# Patient Record
Sex: Male | Born: 1965
Health system: Southern US, Community
[De-identification: ages and names within clinical notes are randomized; demographics above are authoritative.]

## PROBLEM LIST (undated history)

## (undated) DIAGNOSIS — I10 Essential (primary) hypertension: Secondary | ICD-10-CM

## (undated) DIAGNOSIS — K611 Rectal abscess: Secondary | ICD-10-CM

## (undated) DIAGNOSIS — J310 Chronic rhinitis: Secondary | ICD-10-CM

## (undated) DIAGNOSIS — T485X5A Adverse effect of other anti-common-cold drugs, initial encounter: Secondary | ICD-10-CM

## (undated) HISTORY — DX: Essential (primary) hypertension: I10

## (undated) HISTORY — PX: THUMB AMPUTATION: SHX804

## (undated) HISTORY — DX: Rectal abscess: K61.1

## (undated) HISTORY — DX: Adverse effect of other anti-common-cold drugs, initial encounter: T48.5X5A

## (undated) HISTORY — PX: TESTICLE SURGERY: SHX794

## (undated) HISTORY — DX: Chronic rhinitis: J31.0

---

## 1999-11-27 ENCOUNTER — Encounter: Payer: Self-pay | Admitting: *Deleted

## 1999-11-27 ENCOUNTER — Ambulatory Visit (HOSPITAL_COMMUNITY): Admission: RE | Admit: 1999-11-27 | Discharge: 1999-11-27 | Payer: Self-pay | Admitting: *Deleted

## 1999-12-10 ENCOUNTER — Encounter: Admission: RE | Admit: 1999-12-10 | Discharge: 2000-03-09 | Payer: Self-pay | Admitting: *Deleted

## 2002-11-23 ENCOUNTER — Encounter: Payer: Self-pay | Admitting: Occupational Medicine

## 2002-11-23 ENCOUNTER — Encounter: Admission: RE | Admit: 2002-11-23 | Discharge: 2002-11-23 | Payer: Self-pay | Admitting: Occupational Medicine

## 2005-08-09 ENCOUNTER — Emergency Department (HOSPITAL_COMMUNITY): Admission: EM | Admit: 2005-08-09 | Discharge: 2005-08-09 | Payer: Self-pay | Admitting: Family Medicine

## 2005-11-01 ENCOUNTER — Emergency Department (HOSPITAL_COMMUNITY): Admission: EM | Admit: 2005-11-01 | Discharge: 2005-11-01 | Payer: Self-pay | Admitting: Emergency Medicine

## 2008-02-14 ENCOUNTER — Ambulatory Visit: Payer: Self-pay | Admitting: Family Medicine

## 2008-02-14 ENCOUNTER — Encounter: Payer: Self-pay | Admitting: Family Medicine

## 2008-02-14 DIAGNOSIS — I10 Essential (primary) hypertension: Secondary | ICD-10-CM

## 2008-02-14 LAB — CONVERTED CEMR LAB
ALT: 28 units/L (ref 0–53)
AST: 15 units/L (ref 0–37)
Albumin: 4.8 g/dL (ref 3.5–5.2)
Alkaline Phosphatase: 75 units/L (ref 39–117)
BUN: 17 mg/dL (ref 6–23)
Bilirubin Urine: NEGATIVE
CO2: 22 meq/L (ref 19–32)
Calcium: 10 mg/dL (ref 8.4–10.5)
Chloride: 104 meq/L (ref 96–112)
Creatinine, Ser: 1.13 mg/dL (ref 0.40–1.50)
Glucose, Bld: 96 mg/dL (ref 70–99)
Glucose, Urine, Semiquant: NEGATIVE
HCT: 44.9 % (ref 39.0–52.0)
Hemoglobin: 15.4 g/dL (ref 13.0–17.0)
Ketones, urine, test strip: NEGATIVE
MCHC: 34.3 g/dL (ref 30.0–36.0)
MCV: 87.2 fL (ref 78.0–100.0)
Nitrite: NEGATIVE
Platelets: 160 10*3/uL (ref 150–400)
Potassium: 4.4 meq/L (ref 3.5–5.3)
Protein, U semiquant: NEGATIVE
RBC: 5.15 M/uL (ref 4.22–5.81)
RDW: 14.8 % (ref 11.5–15.5)
Sodium: 141 meq/L (ref 135–145)
Specific Gravity, Urine: 1.02
TSH: 1.144 microintl units/mL (ref 0.350–4.50)
Total Bilirubin: 0.6 mg/dL (ref 0.3–1.2)
Total Protein: 8.5 g/dL — ABNORMAL HIGH (ref 6.0–8.3)
Urobilinogen, UA: 1
WBC Urine, dipstick: NEGATIVE
WBC: 5.3 10*3/uL (ref 4.0–10.5)
pH: 7

## 2008-02-16 ENCOUNTER — Encounter: Payer: Self-pay | Admitting: Family Medicine

## 2008-02-27 ENCOUNTER — Encounter: Payer: Self-pay | Admitting: Family Medicine

## 2008-02-27 ENCOUNTER — Ambulatory Visit: Payer: Self-pay | Admitting: Family Medicine

## 2008-02-27 LAB — CONVERTED CEMR LAB
Cholesterol: 174 mg/dL (ref 0–200)
HDL: 36 mg/dL — ABNORMAL LOW (ref >39)
LDL Cholesterol: 119 mg/dL — ABNORMAL HIGH (ref 0–99)
Total CHOL/HDL Ratio: 4.8
Triglycerides: 93 mg/dL (ref <150)
VLDL: 19 mg/dL (ref 0–40)

## 2008-03-05 ENCOUNTER — Ambulatory Visit: Payer: Self-pay | Admitting: Family Medicine

## 2008-03-05 ENCOUNTER — Encounter: Payer: Self-pay | Admitting: Family Medicine

## 2008-03-06 ENCOUNTER — Telehealth: Payer: Self-pay | Admitting: *Deleted

## 2008-03-06 LAB — CONVERTED CEMR LAB
Calcium: 10.1 mg/dL (ref 8.4–10.5)
Glucose, Bld: 95 mg/dL (ref 70–99)
Potassium: 4 meq/L (ref 3.5–5.3)
Sodium: 140 meq/L (ref 135–145)

## 2008-10-09 ENCOUNTER — Ambulatory Visit: Payer: Self-pay | Admitting: Family Medicine

## 2008-10-21 ENCOUNTER — Telehealth: Payer: Self-pay | Admitting: Family Medicine

## 2008-10-21 ENCOUNTER — Ambulatory Visit: Payer: Self-pay | Admitting: Family Medicine

## 2009-03-23 ENCOUNTER — Inpatient Hospital Stay (HOSPITAL_COMMUNITY): Admission: EM | Admit: 2009-03-23 | Discharge: 2009-03-24 | Payer: Self-pay | Admitting: Emergency Medicine

## 2009-04-17 ENCOUNTER — Ambulatory Visit: Payer: Self-pay | Admitting: Family Medicine

## 2009-04-18 ENCOUNTER — Encounter: Payer: Self-pay | Admitting: Family Medicine

## 2009-04-20 ENCOUNTER — Encounter: Payer: Self-pay | Admitting: Family Medicine

## 2009-12-23 ENCOUNTER — Encounter: Payer: Self-pay | Admitting: Family Medicine

## 2009-12-30 ENCOUNTER — Encounter: Payer: Self-pay | Admitting: Family Medicine

## 2009-12-30 ENCOUNTER — Ambulatory Visit: Payer: Self-pay | Admitting: Family Medicine

## 2009-12-30 LAB — CONVERTED CEMR LAB
ALT: 25 units/L (ref 0–53)
Albumin: 4.6 g/dL (ref 3.5–5.2)
CO2: 25 meq/L (ref 19–32)
Chloride: 107 meq/L (ref 96–112)
Direct LDL: 126 mg/dL — ABNORMAL HIGH
Glucose, Bld: 89 mg/dL (ref 70–99)
PSA: 1.36 ng/mL (ref 0.10–4.00)
Potassium: 4.1 meq/L (ref 3.5–5.3)
Sodium: 140 meq/L (ref 135–145)
Total Bilirubin: 0.6 mg/dL (ref 0.3–1.2)
Total Protein: 7.9 g/dL (ref 6.0–8.3)

## 2009-12-31 ENCOUNTER — Encounter: Payer: Self-pay | Admitting: Family Medicine

## 2010-05-06 ENCOUNTER — Ambulatory Visit (HOSPITAL_COMMUNITY): Admission: RE | Admit: 2010-05-06 | Discharge: 2010-05-06 | Payer: Self-pay | Admitting: Orthopedic Surgery

## 2010-05-20 ENCOUNTER — Ambulatory Visit (HOSPITAL_COMMUNITY): Admission: RE | Admit: 2010-05-20 | Discharge: 2010-05-20 | Payer: Self-pay | Admitting: Orthopedic Surgery

## 2010-08-04 NOTE — Letter (Signed)
Summary: Generic Letter  [facility]  [address]   [place], [place] [address]   Phone: [phone]  Fax: [phone]    12/31/2009  Joseph Singleton [address] [place], [place]  [address]  Dear Joseph Singleton,   I just wanted to let you know that your lab results were normal.  Your cholesterol is a little higher than I would like, but not high enough to start medicine.  Be sure to exercise, eat low-fat/low cholesterol foods.  Also, if you are interested in nutrition counseling, you can call [facility] and schedule an appointment with Dr. Gerilyn Pilgrim, our nutritionist.  Please call if you have any questions or concerns.           Sincerely,   Asher Muir MD  Appended Document: Generic Letter mailed

## 2010-08-04 NOTE — Letter (Signed)
Summary: Generic Letter  [facility]  [address]   [place], [place] [address]   Phone: [phone]  Fax: [phone]    12/23/2009  DRAIDEN MIRSKY [address] [place], [place]  [address]  Dear Mr. SCHANK,    You have not been seen in our office in some time for your blood pressure.  Please call [phone] and schedule an office visit.  I look forward to seeing you.        Sincerely,   Asher Muir MD  Appended Document: Generic Letter mailed

## 2010-08-04 NOTE — Assessment & Plan Note (Signed)
Summary: hypertension f/u   Vital Signs:  Patient profile:   45 year old male Height:      74.5 inches Weight:      215.5 pounds BMI:     27.40 Temp:     98.0 degrees F oral Pulse rate:   82 / minute BP sitting:   157 / 102  (left arm) Cuff size:   large  Vitals Entered By: Gladstone Pih (December 30, 2009 3:23 PM) CC: htn f/u Is Patient Diabetic? No Pain Assessment Patient in pain? no      Comments out of HTN meds X 2 days   Primary Care Provider:  Asher Muir MD  CC:  htn f/u.  History of Present Illness: 1.  hypertension--out of meds 2 days.  checks at home twice a month.  runs 130s/75-80.  no side effects.  no cp, swelling, sob.  on metop and hctz.  due for labs  Habits & Providers  Alcohol-Tobacco-Diet     Tobacco Status: quit     Tobacco Counseling: to quit use of tobacco products     Year Quit: 2006  Current Medications (verified): 1)  Hydrochlorothiazide 25 Mg Tabs (Hydrochlorothiazide) .... Take 1 Tablet By Mouth Daily 2)  Metoprolol Tartrate 50 Mg Tabs (Metoprolol Tartrate) .... Take 1 Tab By Mouth Two Times A Day For Blood Pressure  Allergies: No Known Drug Allergies  Past History:  Past Medical History: Last updated: 03/05/2008 htn--diagnosed 8/09  Past Surgical History: Last updated: 04/17/2009 left thumb--partial amputation after injury on job surgery on his testicles--not sure the exact diagnosis (not cancerous) incision and drainage in OR for perianal abscess  Family History: Last updated: 02/14/2008 mother--MI heart attack probably in 40s; may have had rheumatic fever  father--deceased at age  , pancreas removed then secondary DM  brother--htn  brother--pancreas removed then secondary DM  sister--lupus, asthma  kids--healthy  Social History: Last updated: 03/05/2008 lives with son, wife, daughter, aunt former smoker:  10-15 pack year history  quit 1-2 years ago occasional etoh < 2 month no ilicit drugs 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    Physical Exam  General:  Well-developed,well-nourished,in no acute distress; alert,appropriate and cooperative throughout examination Lungs:  Normal respiratory effort, chest expands symmetrically. Lungs are clear to auscultation, no crackles or wheezes. Heart:  Normal rate and regular rhythm. S1 and S2 normal without gallop, murmur, click, rub or other extra sounds. Additional Exam:  vital signs reviewed    Impression & Recommendations:  Problem # 1:  HYPERTENSION (ICD-401.9) Assessment Deteriorated worse off of meds.  refill meds.  nurse visit for bp recheck in next few weeks to make sure at goal.  check ldl and cmet.   His updated medication list for this problem includes:    Hydrochlorothiazide 25 Mg Tabs (Hydrochlorothiazide) .Marland Kitchen... Take 1 tablet by mouth daily    Metoprolol Tartrate 50 Mg Tabs (Metoprolol tartrate) .Marland Kitchen... Take 1 tab by mouth two times a day for blood pressure  Orders: Comp Met-[facility] [phone]) T-Lipoprotein (LDL cholesterol)  ([address]-91478) [facility]- Est Level  3 ([address])  Problem # 2:  PROSTATE CANCER SCREEN (ICD-V76.44) Assessment: Comment Only  Orders: PSA-[facility] ([id number])  Complete Medication List: 1)  Hydrochlorothiazide 25 Mg Tabs (Hydrochlorothiazide) .... Take 1 tablet by mouth daily 2)  Metoprolol Tartrate 50 Mg Tabs (Metoprolol  tartrate) .... Take 1 tab by mouth two times a day for blood pressure  Patient Instructions: 1)  It was nice to see you today. 2)  I will call you if there is a problem with your labs. 3)  Make an appointment for a nurse visit for blood pressure check. 4)  Please schedule a follow-up appointment in 6 months with your new doctor.  Prescriptions: METOPROLOL TARTRATE 50 MG TABS (METOPROLOL TARTRATE) take 1 tab by mouth  two times a day for blood pressure  #60 x 6   Entered and Authorized by:   Asher Muir MD   Signed by:   Asher Muir MD on 12/30/2009   Method used:   Electronically to        [facility] (retail)       [address]       [place], [place]  [address]       Ph: [phone]       Fax: [phone]   RxID:   [id number] HYDROCHLOROTHIAZIDE 25 MG TABS (HYDROCHLOROTHIAZIDE) take 1 tablet by mouth daily  #30 x 6   Entered and Authorized by:   Asher Muir MD   Signed by:   Asher Muir MD on 12/30/2009   Method used:   Electronically to        [facility] (retail)       [address]       [place], [place]  [address]       Ph: [phone]       Fax: [phone]   RxID:   [id number]   Prevention & Chronic Care Immunizations   Influenza vaccine: Not documented    Tetanus booster: Not documented    Pneumococcal vaccine: Not documented  Other Screening   PSA: Not documented   PSA ordered.   Smoking status: quit  (12/30/2009)  Lipids   Total Cholesterol: 174  (02/27/2008)   Lipid panel action/deferral: LDL Direct Ordered   LDL: 010  (02/27/2008)   LDL Direct: Not documented   HDL: 36  (02/27/2008)   Triglycerides: 93  (02/27/2008)  Hypertension   Last Blood Pressure: 157 / 102  (12/30/2009)   Serum creatinine: 1.22  (03/05/2008)   Serum potassium 4.0  (03/05/2008) CMP ordered   Self-Management Support :   Personal Goals (by the next clinic visit) :      Personal blood pressure goal: 140/90  (04/17/2009)   Hypertension self-management support: Written self-care plan  (04/17/2009)

## 2010-08-04 NOTE — Assessment & Plan Note (Signed)
Summary: boil on bottom,tcb   Vital Signs:  Patient profile:   45 year old male Height:      74.5 inches Weight:      216 pounds BMI:     27.46 Temp:     98 degrees F oral Pulse rate:   79 / minute BP sitting:   158 / 104  (left arm) Cuff size:   regular  Vitals Entered By: Molly Maduro busick, ma CC: boil on bottom Is Patient Diabetic? No Pain Assessment Patient in pain? yes     Location: rectal Intensity: 3   Primary Care Provider:  Asher Muir MD  CC:  boil on bottom.  History of Present Illness: 45 yo AAM c/o boils on buttocks.  1. Boils: was admitted to [facility]  ~ 1 month ago for perianal abscess requiring I&D in the OR along with IV Abx, was followed by surgery for several weeks to monitor healing abscess and cleared at last visit. noticed 2 new lesions a few days ago and wants to make sure that they are taken care of before needing to go to the hospital again. they are tender, making it hard for him to sit comfortably. they are getting worse per his wife. he has no prior history of abscesses.  2. HTN: Rx HCTZ, Metoprolol. Uncontrolled today and at previous several visits. He is resistant to adding medication today. wants to wait until his visit with PCP.  He denies fever/chills, CP, SOB, N/V/D, HA. dizziness, vision changes, lower extremity edema, abdominal pain.  Habits & Providers  Alcohol-Tobacco-Diet     Tobacco Status: quit  Current Medications (verified): 1)  Flonase 50 Mcg/act Susp (Fluticasone Propionate) .... 2 Sprays in Each Nostril Daily 2)  Hydrochlorothiazide 25 Mg Tabs (Hydrochlorothiazide) .... Take 1 Tablet By Mouth Daily 3)  Metoprolol Tartrate 50 Mg Tabs (Metoprolol Tartrate) .... Take 1 Tab By Mouth Two Times A Day For Blood Pressure 4)  Hydrocodone-Acetaminophen 5-325 Mg Tabs (Hydrocodone-Acetaminophen) .... One By Mouth Q 4-6 Hours As Needed Pain 5)  Bactrim Ds 800-160 Mg Tabs (Sulfamethoxazole-Trimethoprim) .... Two By Mouth Two Times A Day X  14 Days  Allergies (verified): No Known Drug Allergies  Past History:  Past medical, surgical, family and social histories (including risk factors) reviewed, and no changes noted (except as noted below).  Past Medical History: Reviewed history from 03/05/2008 and no changes required. htn--diagnosed 8/09  Past Surgical History: left thumb--partial amputation after injury on job surgery on his testicles--not sure the exact diagnosis (not cancerous) incision and drainage in OR for perianal abscess  Family History: Reviewed history from 02/14/2008 and no changes required. mother--MI heart attack probably in 40s; may have had rheumatic fever  father--deceased at age  , pancreas removed then secondary DM  brother--htn  brother--pancreas removed then secondary DM  sister--lupus, asthma  kids--healthy  Social History: Reviewed history from 03/05/2008 and no changes required. 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  The patient denies fever, weight loss, chest pain, syncope, dyspnea on exertion, peripheral edema, prolonged cough, headaches, and abdominal pain.    Physical Exam  General:  Well-developed,well-nourished, in no acute distress; alert, appropriate and cooperative throughout examination. Vitals reviewed.  Skin:  right buttock: 6 cm firm, erythematous area with central fluctuance, no drainage. right perianal: (old) healing lesion (2 cm) with no drainage, no sloughing. left perianal: 3-4 cm firm, erythematous area with central fluctuance, no drainage. all 3 ttp. Psych:  Oriented X3, memory intact for recent and remote, normally interactive, and good eye contact.     Impression &  Recommendations:  Problem # 1:  CELLULITIS AND ABSCESS OF BUTTOCK (ICD-682.5) Assessment New Incision and Drainage: benefits and risks of procedure discussed. consent obtained. time in noted. area cleaned with betadine and alcohol swabs, local anesthesia using 1% lidocaine with epi, abscesses lanced with 11 blade scalpel, small amount of pus drained from each lesion, cultured obtained, antibiotic ointment applied, pressure bandage applied to each. time out noted. note: lesions superficial, there was no need for irrigation or packing. min bleeding. aftercare instructions given.   Advised to follow up with PCP Monday. Because the patient needed I&D in the OR and IV Abx less than 1 month ago and the lesion on his right buttock was over 5 cm, he was given Rx Bactrim. Pain medication also given.  His updated medication list for this problem includes:    Bactrim Ds 800-160 Mg Tabs (Sulfamethoxazole-trimethoprim) .Marland Kitchen..Marland Kitchen Two by mouth two times a day x 14 days  Orders: Culture, Wound -[facility] (04540) I&D Abcess, simple- [facility] (10060) [facility]- Est  Level 4 (98119)  Problem # 2:  HYPERTENSION (ICD-401.9)  Patient with uncontrolled HTN. He is not interested in adding a medication today. Advised him to f/u with PCP in 1 week or less for this issue. Rec: low salt diet. His updated medication list for this problem includes:    Hydrochlorothiazide 25 Mg Tabs (Hydrochlorothiazide) .Marland Kitchen... Take 1 tablet by mouth daily    Metoprolol Tartrate 50 Mg Tabs (Metoprolol tartrate) .Marland Kitchen... Take 1 tab by mouth two times a day for blood pressure  Orders: [facility]- Est  Level 4 ([address]214)  Complete Medication List: 1)  Flonase 50 Mcg/act Susp (Fluticasone propionate) .... 2 sprays in each nostril daily 2)  Hydrochlorothiazide 25 Mg Tabs (Hydrochlorothiazide) .... Take 1 tablet by mouth daily 3)  Metoprolol Tartrate 50 Mg Tabs (Metoprolol tartrate) .... Take 1 tab by mouth two times a day for blood pressure 4)  Hydrocodone-acetaminophen  5-325 Mg Tabs (Hydrocodone-acetaminophen) .... One by mouth q 4-6 hours as needed pain 5)  Bactrim Ds 800-160 Mg Tabs (Sulfamethoxazole-trimethoprim) .... Two by mouth two times a day x 14 days  Patient Instructions: 1)  It was nice to meet you today. 2)  Follow up on Monday.  Prescriptions: BACTRIM DS 800-160 MG TABS (SULFAMETHOXAZOLE-TRIMETHOPRIM) two by mouth two times a day x 14 days  #56 x 0   Entered and Authorized by:   Helane Rima DO   Signed by:   Helane Rima DO on 04/17/2009   Method used:   Electronically to        [facility] (retail)       [address]       [place], [place]  [address]       Ph: [phone]       Fax: [phone]   RxID:   [id number] BACTRIM DS 800-160 MG TABS (SULFAMETHOXAZOLE-TRIMETHOPRIM) two by mouth two times a day x 14 days  #56 x 0   Entered and Authorized by:   Helane Rima DO   Signed by:   Helane Rima DO on 04/17/2009  Method used:   Print then Give to Patient   RxID:   [id number] HYDROCODONE-ACETAMINOPHEN 5-325 MG TABS (HYDROCODONE-ACETAMINOPHEN) one by mouth q 4-6 hours as needed pain  #30 x 0   Entered and Authorized by:   Helane Rima DO   Signed by:   Helane Rima DO on 04/17/2009   Method used:   Print then Give to Patient   RxID:   [id number]   Prevention & Chronic Care Immunizations   Influenza vaccine: Not documented    Tetanus booster: Not documented    Pneumococcal vaccine: Not documented  Other Screening   PSA: Not documented   Smoking status: quit  (04/17/2009)  Lipids   Total Cholesterol: 174  (02/27/2008)   LDL: 119  (02/27/2008)   LDL Direct: Not documented   HDL: 36  (02/27/2008)   Triglycerides: 93  (02/27/2008)  Hypertension   Last Blood Pressure: 158 / 104  (04/17/2009)   Serum creatinine: 1.22  (03/05/2008)   Serum potassium 4.0  (03/05/2008)    Hypertension flowsheet reviewed?: Yes   Progress toward BP goal: Unchanged  Self-Management  Support :   Personal Goals (by the next clinic visit) :      Personal blood pressure goal: 140/90  (04/17/2009)   Patient will work on the following items until the next clinic visit to reach self-care goals:     Medications and monitoring: take my medicines every day  (04/17/2009)     Eating: drink diet soda or water instead of juice or soda, eat more vegetables, use fresh or frozen vegetables, eat foods that are low in salt, eat baked foods instead of fried foods, eat fruit for snacks and desserts, limit or avoid alcohol  (04/17/2009)     Activity: take a 30 minute walk every day, take the stairs instead of the elevator, park at the far end of the parking lot  (04/17/2009)    Hypertension self-management support: Written self-care plan  (04/17/2009)   Hypertension self-care plan printed.

## 2010-08-24 ENCOUNTER — Encounter: Payer: Self-pay | Admitting: *Deleted

## 2010-10-09 LAB — BASIC METABOLIC PANEL
BUN: 12 mg/dL (ref 6–23)
Creatinine, Ser: 1.14 mg/dL (ref 0.4–1.5)
GFR calc non Af Amer: >60 mL/min (ref >60)
Glucose, Bld: 112 mg/dL — ABNORMAL HIGH (ref 70–99)
Potassium: 3.7 mEq/L (ref 3.5–5.1)

## 2010-10-09 LAB — ANAEROBIC CULTURE

## 2010-10-09 LAB — DIFFERENTIAL
Basophils Absolute: 0 10*3/uL (ref 0.0–0.1)
Basophils Relative: 0 % (ref 0–1)
Lymphocytes Relative: 10 % — ABNORMAL LOW (ref 12–46)
Neutro Abs: 9.3 10*3/uL — ABNORMAL HIGH (ref 1.7–7.7)
Neutrophils Relative %: 82 % — ABNORMAL HIGH (ref 43–77)

## 2010-10-09 LAB — CULTURE, BLOOD (ROUTINE X 2)
Culture: NO GROWTH
Culture: NO GROWTH

## 2010-10-09 LAB — CULTURE, ROUTINE-ABSCESS

## 2010-10-09 LAB — CBC
HCT: 42.7 % (ref 39.0–52.0)
MCV: 89.9 fL (ref 78.0–100.0)
Platelets: 127 10*3/uL — ABNORMAL LOW (ref 150–400)
RDW: 13.9 % (ref 11.5–15.5)

## 2010-11-20 NOTE — Op Note (Signed)
NAMEJOSHUE, BADAL NO.:  [id number]   MEDICAL RECORD NO.:  [id number]          PATIENT TYPE:  EMS   LOCATION:  [facility]                         FACILITY:  [facility]   PHYSICIAN:  Artist Pais. Weingold, M.D.DATE OF BIRTH:  1966/06/14   DATE OF PROCEDURE:  11/01/2005  DATE OF DISCHARGE:  11/01/2005                                 OPERATIVE REPORT   PREOPERATIVE DIAGNOSIS:  Left thumb tibial amputation with exposed  phalangeal bone, nail plate fracture, nailbed laceration.   POSTOPERATIVE DIAGNOSIS:  Left thumb tibial amputation with exposed  phalangeal bone, nail plate fracture, nailbed laceration.   PROCEDURE:  Incision and drainage and V-Y flap coverage, left thumb tip  amputation.   SURGEON:  Artist Pais. Mina Marble, M.D.   ASSISTANT:  None.   ANESTHESIA:  General.   TOURNIQUET TIME:  Twenty-five minutes.   COMPLICATIONS:  None.   DRAINS:  None.   OPERATIVE REPORT:  Patient was taken to the operating room with the  induction of adequate general anesthesia.  The left upper extremity was  prepped and draped in the usual sterile fashion.  An esmarch was used to  exsanguinate the limb.  The tourniquet was then inflated to 250 mmHg.  At  this point in time, the left thumb was approached surgically.  There was an  obvious nail plate fracture, nailbed laceration, exposed distal phalangeal  bone with the very distal 2 mm of the tip exposed.  After thorough  irrigation and debridement of all nonviable materials, the very distal  aspect of the distal phalanx was carefully rongeured down to a smooth  surface.  The nail plate was also contoured to fit the edge of the bone.  After this was done, two complex lacerations, radial and ulnar side  respectively, were carefully closed with 4-0 nylon.  After this was done,  there was still a 1.5 cm area of distal phalangeal bone that was exposed.  This was carefully done covered using a V-Y advancement flap from volar to  dorsal.   It was raised and advanced 1 cm, sutured to the nail plate and the  proximal portion was closed with 4-0 nylon.  After this was done, the  remaining aspects were carefully approximated using 4-0 nylon, including  three  sutures into the nail plate and secured distally, thus covering the exposed  distal phalangeal bone.  The wound was then irrigated.  It was then dressed  in Xeroform, 4x4s, and a compressive light Coban dressing.  This is  proceeded by injection of 0.25% Marcaine for postoperative pain control.      Artist Pais Mina Marble, M.D.  Electronically Signed     MAW/MEDQ  D:  11/01/2005  T:  11/02/2005  Job:  [id number]

## 2010-11-20 NOTE — Consult Note (Signed)
NAMELOEL, BETANCUR NO.:  [id number]   MEDICAL RECORD NO.:  [id number]          PATIENT TYPE:  EMS   LOCATION:  [facility]                         FACILITY:  [facility]   PHYSICIAN:  Artist Pais. Weingold, M.D.DATE OF BIRTH:  October 30, 1965   DATE OF CONSULTATION:  11/01/2005  DATE OF DISCHARGE:  11/01/2005                                   CONSULTATION   REASON FOR CONSULTATION:  Nicholes Hibler is a very pleasant 45 year old left  hand dominant male who got his dominant left thumb caught in an industrial  machine earlier today, November 01, 2005.  He presents today with a tip  amputation and exposed distal phalangeal bone, nail plate fracture, and nail  bed laceration.  He is an otherwise healthy 45 year old male with NO KNOWN  DRUG ALLERGIES, no current medications, and no recent hospitalizations or  surgery.   FAMILY HISTORY:  Noncontributory.   SOCIAL HISTORY:  Noncontributory.   PHYSICAL EXAMINATION:  GENERAL:  Well-nourished male, pleasant.  EXTREMITIES:  On examination of his left thumb, he has an obvious open tip  injury with exposed distal phalangeal bone, nail plate fracture, and nail  bed laceration.  He has full __________ and EPL function, but again,  significant injury to the tip of his left thumb.   IMPRESSION:  A 45 year old male with a tip injury, dominant left thumb.   RECOMMENDATIONS:  At this point in time, we will take him to the operating  room at the patient's request versus doing it in the ER under local  anesthetic for I&D, revision of amputation, and V-Y flap if necessary, left  thumb.      Artist Pais Mina Marble, M.D.  Electronically Signed     MAW/MEDQ  D:  11/01/2005  T:  11/02/2005  Job:  [id number]

## 2011-01-11 ENCOUNTER — Other Ambulatory Visit: Payer: Self-pay | Admitting: Family Medicine

## 2011-01-11 MED ORDER — METOPROLOL TARTRATE 50 MG PO TABS: 50.0000 mg | ORAL_TABLET | Freq: Two times a day (BID) | ORAL | Status: DC

## 2011-01-11 MED ORDER — HYDROCHLOROTHIAZIDE 25 MG PO TABS: 25.0000 mg | ORAL_TABLET | Freq: Every day | ORAL | Status: DC

## 2011-01-11 NOTE — Telephone Encounter (Signed)
Refilled based on fax request.  Must make appointment before additional refills.

## 2011-01-15 ENCOUNTER — Other Ambulatory Visit: Payer: Self-pay | Admitting: Family Medicine

## 2011-01-15 DIAGNOSIS — I10 Essential (primary) hypertension: Secondary | ICD-10-CM

## 2011-01-15 MED ORDER — HYDROCHLOROTHIAZIDE 25 MG PO TABS: 25.0000 mg | ORAL_TABLET | Freq: Every day | ORAL | Status: DC

## 2011-03-09 ENCOUNTER — Other Ambulatory Visit: Payer: Self-pay | Admitting: Family Medicine

## 2011-03-10 NOTE — Telephone Encounter (Signed)
Refill request

## 2011-05-07 ENCOUNTER — Emergency Department (HOSPITAL_COMMUNITY)
Admission: EM | Admit: 2011-05-07 | Discharge: 2011-05-07 | Disposition: A | Discharge status: Home / Self Care | Payer: BC Managed Care – PPO | Attending: Emergency Medicine | Admitting: Emergency Medicine

## 2011-05-07 ENCOUNTER — Emergency Department (HOSPITAL_COMMUNITY): Payer: BC Managed Care – PPO

## 2011-05-07 DIAGNOSIS — L03317 Cellulitis of buttock: Secondary | ICD-10-CM | POA: Insufficient documentation

## 2011-05-07 DIAGNOSIS — R6883 Chills (without fever): Secondary | ICD-10-CM | POA: Insufficient documentation

## 2011-05-07 DIAGNOSIS — L0231 Cutaneous abscess of buttock: Secondary | ICD-10-CM | POA: Insufficient documentation

## 2011-05-07 DIAGNOSIS — I10 Essential (primary) hypertension: Secondary | ICD-10-CM | POA: Insufficient documentation

## 2011-05-07 LAB — DIFFERENTIAL
Eosinophils Absolute: 0 10*3/uL (ref 0.0–0.7)
Eosinophils Relative: 0 % (ref 0–5)
Lymphocytes Relative: 14 % (ref 12–46)
Lymphs Abs: 1.4 10*3/uL (ref 0.7–4.0)
Monocytes Relative: 7 % (ref 3–12)

## 2011-05-07 LAB — CBC
HCT: 38 % — ABNORMAL LOW (ref 39.0–52.0)
MCH: 30.1 pg (ref 26.0–34.0)
MCV: 85.4 fL (ref 78.0–100.0)
RBC: 4.45 MIL/uL (ref 4.22–5.81)
WBC: 10.1 10*3/uL (ref 4.0–10.5)

## 2011-05-07 LAB — BASIC METABOLIC PANEL
BUN: 7 mg/dL (ref 6–23)
CO2: 27 mEq/L (ref 19–32)
Calcium: 9.6 mg/dL (ref 8.4–10.5)
Chloride: 103 mEq/L (ref 96–112)
Creatinine, Ser: 1.02 mg/dL (ref 0.50–1.35)

## 2011-05-07 MED ORDER — IOHEXOL 300 MG/ML  SOLN
100.0000 mL | Freq: Once | INTRAMUSCULAR | Status: AC | PRN
  Administered 2011-05-07: 100 mL via INTRAVENOUS

## 2011-05-25 ENCOUNTER — Encounter: Payer: Self-pay | Admitting: Family Medicine

## 2011-06-01 ENCOUNTER — Encounter: Payer: Self-pay | Admitting: Family Medicine

## 2011-06-07 ENCOUNTER — Encounter: Payer: Self-pay | Admitting: Family Medicine

## 2011-06-07 ENCOUNTER — Ambulatory Visit (INDEPENDENT_AMBULATORY_CARE_PROVIDER_SITE_OTHER): Payer: BC Managed Care – PPO | Admitting: Family Medicine

## 2011-06-07 VITALS — BP 118/79 | HR 60 | Temp 98.2°F | Ht 75.0 in | Wt 206.5 lb

## 2011-06-07 DIAGNOSIS — Z23 Encounter for immunization: Secondary | ICD-10-CM

## 2011-06-07 DIAGNOSIS — I10 Essential (primary) hypertension: Secondary | ICD-10-CM

## 2011-06-07 DIAGNOSIS — K611 Rectal abscess: Secondary | ICD-10-CM

## 2011-06-07 DIAGNOSIS — K612 Anorectal abscess: Secondary | ICD-10-CM

## 2011-06-07 HISTORY — DX: Rectal abscess: K61.1

## 2011-06-07 MED ORDER — METOPROLOL TARTRATE 50 MG PO TABS: 50.0000 mg | ORAL_TABLET | Freq: Two times a day (BID) | ORAL | Status: DC

## 2011-06-07 MED ORDER — HYDROCHLOROTHIAZIDE 25 MG PO TABS: 25.0000 mg | ORAL_TABLET | Freq: Every day | ORAL | Status: DC

## 2011-06-07 NOTE — Assessment & Plan Note (Signed)
Resolving for patient.

## 2011-06-07 NOTE — Progress Notes (Signed)
  Subjective:    Patient ID: Joseph Singleton, male    DOB: June 01, 1966, 45 y.o.   MRN: [id number]  HPI  CPE.  1. ED f/u. Seen for peri-rectal abscess in ED, underwent I&D after CT showed now underlying invasion of deep tissues. Wound is heeling and no longer a problem according to patient.  2. HTN. No problems with medications. Has not checked BP at home recently. Denies edema, syncope, visual changes. On review of records, has lost 6 lbs in past year. Does not smoke, has history of 10-15 pack years.  I have reviewed the patient's medical history in detail and updated the computerized patient record. Refuses flu shot today.  Review of Systems See HPI otherwise negative.    Objective:   Physical Exam  Vitals reviewed. Constitutional: He is oriented to person, place, and time. He appears well-developed and well-nourished. No distress.  HENT:  Head: Normocephalic and atraumatic.  Mouth/Throat: Oropharynx is clear and moist. No oropharyngeal exudate.  Eyes: EOM are normal. Pupils are equal, round, and reactive to light.  Cardiovascular: Normal rate, regular rhythm, normal heart sounds and intact distal pulses.   No murmur heard. Pulmonary/Chest: Effort normal and breath sounds normal. No respiratory distress. He has no wheezes. He has no rales.  Musculoskeletal: He exhibits no edema and no tenderness.  Neurological: He is alert and oriented to person, place, and time. He exhibits normal muscle tone. Coordination normal.  Psychiatric: He has a normal mood and affect.          Assessment & Plan:

## 2011-06-07 NOTE — Patient Instructions (Signed)
Nice to see you. Blood pressure in normal today! Make an appointment in one year for check up. I have sent meds to [facility].  Hypertension As your heart beats, it forces blood through your arteries. This force is your blood pressure. If the pressure is too high, it is called hypertension (HTN) or high blood pressure. HTN is dangerous because you may have it and not know it. High blood pressure may mean that your heart has to work harder to pump blood. Your arteries may be narrow or stiff. The extra work puts you at risk for heart disease, stroke, and other problems.  Blood pressure consists of two numbers, a higher number over a lower, 110/72, for example. It is stated as "110 over 72." The ideal is below 120 for the top number (systolic) and under 80 for the bottom (diastolic). Write down your blood pressure today. You should pay close attention to your blood pressure if you have certain conditions such as:  Heart failure.   Prior heart attack.   Diabetes   Chronic kidney disease.   Prior stroke.   Multiple risk factors for heart disease.  To see if you have HTN, your blood pressure should be measured while you are seated with your arm held at the level of the heart. It should be measured at least twice. A one-time elevated blood pressure reading (especially in the Emergency Department) does not mean that you need treatment. There may be conditions in which the blood pressure is different between your right and left arms. It is important to see your caregiver soon for a recheck. Most people have essential hypertension which means that there is not a specific cause. This type of high blood pressure may be lowered by changing lifestyle factors such as:  Stress.   Smoking.   Lack of exercise.   Excessive weight.   Drug/tobacco/alcohol use.   Eating less salt.  Most people do not have symptoms from high blood pressure until it has caused damage to the body. Effective treatment can  often prevent, delay or reduce that damage. TREATMENT  When a cause has been identified, treatment for high blood pressure is directed at the cause. There are a large number of medications to treat HTN. These fall into several categories, and your caregiver will help you select the medicines that are best for you. Medications may have side effects. You should review side effects with your caregiver. If your blood pressure stays high after you have made lifestyle changes or started on medicines,   Your medication(s) may need to be changed.   Other problems may need to be addressed.   Be certain you understand your prescriptions, and know how and when to take your medicine.   Be sure to follow up with your caregiver within the time frame advised (usually within two weeks) to have your blood pressure rechecked and to review your medications.   If you are taking more than one medicine to lower your blood pressure, make sure you know how and at what times they should be taken. Taking two medicines at the same time can result in blood pressure that is too low.  SEEK IMMEDIATE MEDICAL CARE IF:  You develop a severe headache, blurred or changing vision, or confusion.   You have unusual weakness or numbness, or a faint feeling.   You have severe chest or abdominal pain, vomiting, or breathing problems.  MAKE SURE YOU:   Understand these instructions.   Will watch your condition.  Will get help right away if you are not doing well or get worse.  Document Released: 06/21/2005 Document Revised: 03/03/2011 Document Reviewed: 02/09/2008 [facility] Patient Information 2012 [place], [place].

## 2011-06-07 NOTE — Assessment & Plan Note (Signed)
At goal on current meds, will refill today. Recent labs normal. Will repeat FLP in one year as last check was below 130 and has controlled hypertension.

## 2011-08-30 ENCOUNTER — Telehealth: Payer: Self-pay | Admitting: *Deleted

## 2011-08-30 ENCOUNTER — Other Ambulatory Visit: Payer: Self-pay | Admitting: *Deleted

## 2011-08-30 NOTE — Telephone Encounter (Signed)
Medical Evaluation for Social Services received and placed in Dr. Ernest Haber box for completion.  Kathrine Cords, Nori Riis

## 2011-09-01 ENCOUNTER — Ambulatory Visit (INDEPENDENT_AMBULATORY_CARE_PROVIDER_SITE_OTHER): Payer: BC Managed Care – PPO | Admitting: *Deleted

## 2011-09-01 DIAGNOSIS — Z111 Encounter for screening for respiratory tuberculosis: Secondary | ICD-10-CM

## 2011-09-01 NOTE — Telephone Encounter (Signed)
Patient is coming in today for PPD.  Forms given to [employer].  Joseph Singleton

## 2011-09-01 NOTE — Telephone Encounter (Signed)
Completed form. Approved for foster parenting medically.

## 2011-09-03 ENCOUNTER — Ambulatory Visit (INDEPENDENT_AMBULATORY_CARE_PROVIDER_SITE_OTHER): Payer: BC Managed Care – PPO | Admitting: *Deleted

## 2011-09-03 DIAGNOSIS — R7611 Nonspecific reaction to tuberculin skin test without active tuberculosis: Secondary | ICD-10-CM

## 2011-09-03 NOTE — Telephone Encounter (Signed)
Form given to patient . Appointment scheduled at [facility] 09/06/2011 at 3:30 for positive PPD.

## 2011-09-03 NOTE — Progress Notes (Signed)
PPD reading 20 mm X 20 mm. Dr. Deirdre Priest consulted with reading and verified. Appointment scheduled at [facility]  with TB control nurse  for 09/06/2011 at 3:30.

## 2011-09-07 ENCOUNTER — Ambulatory Visit
Admission: RE | Admit: 2011-09-07 | Discharge: 2011-09-07 | Disposition: A | Discharge status: Home / Self Care | Payer: No Typology Code available for payment source | Source: Ambulatory Visit | Attending: Infectious Diseases | Admitting: Infectious Diseases

## 2011-09-07 ENCOUNTER — Other Ambulatory Visit: Payer: Self-pay | Admitting: Infectious Diseases

## 2011-09-07 DIAGNOSIS — R7611 Nonspecific reaction to tuberculin skin test without active tuberculosis: Secondary | ICD-10-CM

## 2012-01-24 ENCOUNTER — Ambulatory Visit: Payer: Self-pay | Admitting: Sports Medicine

## 2012-06-07 ENCOUNTER — Other Ambulatory Visit: Payer: Self-pay | Admitting: *Deleted

## 2012-06-07 DIAGNOSIS — I10 Essential (primary) hypertension: Secondary | ICD-10-CM

## 2012-06-07 MED ORDER — METOPROLOL TARTRATE 50 MG PO TABS: 50.0000 mg | ORAL_TABLET | Freq: Two times a day (BID) | ORAL | Status: DC

## 2012-06-07 MED ORDER — HYDROCHLOROTHIAZIDE 25 MG PO TABS: 25.0000 mg | ORAL_TABLET | Freq: Every day | ORAL | Status: DC

## 2012-06-22 ENCOUNTER — Encounter: Payer: Self-pay | Admitting: Family Medicine

## 2012-06-22 ENCOUNTER — Ambulatory Visit (INDEPENDENT_AMBULATORY_CARE_PROVIDER_SITE_OTHER): Payer: BC Managed Care – PPO | Admitting: Family Medicine

## 2012-06-22 VITALS — BP 145/95 | HR 78 | Temp 98.7°F | Ht 74.5 in | Wt 211.6 lb

## 2012-06-22 DIAGNOSIS — J310 Chronic rhinitis: Secondary | ICD-10-CM

## 2012-06-22 DIAGNOSIS — I10 Essential (primary) hypertension: Secondary | ICD-10-CM

## 2012-06-22 LAB — COMPREHENSIVE METABOLIC PANEL
Albumin: 4.5 g/dL (ref 3.5–5.2)
BUN: 18 mg/dL (ref 6–23)
CO2: 28 mEq/L (ref 19–32)
Calcium: 10.1 mg/dL (ref 8.4–10.5)
Chloride: 101 mEq/L (ref 96–112)
Creat: 1.14 mg/dL (ref 0.50–1.35)
Glucose, Bld: 93 mg/dL (ref 70–99)
Potassium: 4.2 mEq/L (ref 3.5–5.3)

## 2012-06-22 LAB — CBC
MCH: 29.5 pg (ref 26.0–34.0)
Platelets: 166 10*3/uL (ref 150–400)
RBC: 4.91 MIL/uL (ref 4.22–5.81)
WBC: 6.7 10*3/uL (ref 4.0–10.5)

## 2012-06-22 LAB — TSH: TSH: 1.581 u[IU]/mL (ref 0.350–4.500)

## 2012-06-22 LAB — POCT GLYCOSYLATED HEMOGLOBIN (HGB A1C): Hemoglobin A1C: 5.4

## 2012-06-22 MED ORDER — FLUTICASONE PROPIONATE 50 MCG/ACT NA SUSP: 2.0000 | Freq: Every day | NASAL | Status: DC

## 2012-06-22 MED ORDER — METOPROLOL TARTRATE 50 MG PO TABS: 100.0000 mg | ORAL_TABLET | Freq: Two times a day (BID) | ORAL | Status: DC

## 2012-06-22 NOTE — Patient Instructions (Addendum)
Nice to see you again. Goal blood pressure is < 140/90. Try to increase your metoprolol to 100 mg twice daily. Notify doctor if side effects of medications. Stop taking afrin. Start taking flonase daily for best results.

## 2012-06-23 ENCOUNTER — Encounter: Payer: Self-pay | Admitting: Family Medicine

## 2012-06-23 DIAGNOSIS — J310 Chronic rhinitis: Secondary | ICD-10-CM

## 2012-06-23 DIAGNOSIS — T485X5A Adverse effect of other anti-common-cold drugs, initial encounter: Secondary | ICD-10-CM

## 2012-06-23 HISTORY — DX: Adverse effect of other anti-common-cold drugs, initial encounter: T48.5X5A

## 2012-06-23 HISTORY — DX: Chronic rhinitis: J31.0

## 2012-06-23 NOTE — Assessment & Plan Note (Signed)
Above goal especially diastolic above 100. Compliant with medication, will increase metoprolol to 100 mg BID. Patient agrees to notify MD if this causes any side effects or is intolerable. If this fails, would change HCTZ to ACEi/HCTZ combination. Will check labs today. Advise f/u for nurse visit in one month or with MD if problems.

## 2012-06-23 NOTE — Assessment & Plan Note (Signed)
Most likely etiology is dependence on afrin. Advised patient to stop this cold [place] or taper, and may use flonase temporarily for relief. Suspect this to improve over next days to weeks. F/u prn.

## 2012-06-23 NOTE — Progress Notes (Signed)
  Subjective:    Patient ID: Joseph Singleton, male    DOB: [birth year], 46 y.o.   MRN: [id number]  HPI CPE  1. HTN. Compliant with medications, denies any side effects. He check infrequently at home usually 130/90s. He works as a Chartered certified accountant, and sometimes plays sports for exercise. Weight is up 5 lbs, but he doesn't notice. Rare alcohol use. Quit smoking 2008. Denies excessive daytime fatigue, breathing problems at night, leg edema, chest pain, dyspnea, abdominal pain, urinary difficulty, fever, rash, myalgias, fatigue, spots in vision, palpitations, weight loss.  2. Stuffy nose. Had a cold 2-3 weeks ago and now complains of persistently "stuffy nose." he is using daily afrin for past 2 weeks which helps for short period then returns. Denies any facial pain, purulent drainage, cough, chest pain, wheezing, post nasal drip, fever, ear pain.   I have reviewed and updated meds, allergies, problem list, past med/surg/social history.   Review of Systems Completed ROS 11 point form is negative except as noted above.     Objective:   Physical Exam  Vitals reviewed. Constitutional: He is oriented to person, place, and time. He appears well-developed and well-nourished. No distress.  HENT:  Head: Normocephalic and atraumatic.  Mouth/Throat: Oropharynx is clear and moist. No oropharyngeal exudate.       Slight nasal congestion audible. No significant nasal edema or lesions.   Eyes: EOM are normal. Pupils are equal, round, and reactive to light. Right eye exhibits no discharge. Left eye exhibits no discharge. No scleral icterus.  Neck: Neck supple. No thyromegaly present.  Cardiovascular: Normal rate, regular rhythm, normal heart sounds and intact distal pulses.   No murmur heard. Pulmonary/Chest: Effort normal and breath sounds normal. No respiratory distress. He has no wheezes. He has no rales.  Abdominal: Bowel sounds are normal. He exhibits no distension. There is no tenderness. There is no guarding.   Musculoskeletal: He exhibits no edema and no tenderness.  Lymphadenopathy:    He has no cervical adenopathy.  Neurological: He is alert and oriented to person, place, and time. Coordination normal.  Skin: No rash noted. He is not diaphoretic.  Psychiatric: He has a normal mood and affect. His behavior is normal.       Assessment & Plan:

## 2012-10-04 ENCOUNTER — Other Ambulatory Visit: Payer: Self-pay | Admitting: Family Medicine

## 2012-11-06 ENCOUNTER — Other Ambulatory Visit: Payer: Self-pay | Admitting: Family Medicine

## 2012-12-07 ENCOUNTER — Other Ambulatory Visit: Payer: Self-pay | Admitting: Family Medicine

## 2012-12-14 ENCOUNTER — Encounter: Payer: Self-pay | Admitting: Family Medicine

## 2012-12-14 ENCOUNTER — Ambulatory Visit (INDEPENDENT_AMBULATORY_CARE_PROVIDER_SITE_OTHER): Payer: BC Managed Care – PPO | Admitting: Family Medicine

## 2012-12-14 VITALS — BP 150/90 | HR 67 | Temp 99.1°F | Ht 74.5 in | Wt 224.0 lb

## 2012-12-14 DIAGNOSIS — I10 Essential (primary) hypertension: Secondary | ICD-10-CM

## 2012-12-14 MED ORDER — METOPROLOL TARTRATE 100 MG PO TABS: ORAL_TABLET | ORAL | Status: DC

## 2012-12-14 MED ORDER — HYDROCHLOROTHIAZIDE 25 MG PO TABS: 25.0000 mg | ORAL_TABLET | Freq: Every day | ORAL | Status: DC

## 2012-12-14 NOTE — Patient Instructions (Addendum)
Your blood pressure is too high today. Needs to be less than 140/90.  Start taking hydrochlorothiazide with the metoprolol twice daily. Make an appointment for nurse check in next 1-2 weeks.  I will call if we need to adjust medications again.   DASH Diet The DASH diet stands for "Dietary Approaches to Stop Hypertension." It is a healthy eating plan that has been shown to reduce high blood pressure (hypertension) in as little as 14 days, while also possibly providing other significant health benefits. These other health benefits include reducing the risk of breast cancer after menopause and reducing the risk of type 2 diabetes, heart disease, colon cancer, and stroke. Health benefits also include weight loss and slowing kidney failure in patients with chronic kidney disease.  DIET GUIDELINES  Limit salt (sodium). Your diet should contain less than 1500 mg of sodium daily.  Limit refined or processed carbohydrates. Your diet should include mostly whole grains. Desserts and added sugars should be used sparingly.  Include small amounts of heart-healthy fats. These types of fats include nuts, oils, and tub margarine. Limit saturated and trans fats. These fats have been shown to be harmful in the body. CHOOSING FOODS  The following food groups are based on a 2000 calorie diet. See your Registered Dietitian for individual calorie needs. Grains and Grain Products (6 to 8 servings daily)  Eat More Often: Whole-wheat bread, Shin rice, whole-grain or wheat pasta, quinoa, popcorn without added fat or salt (air popped).  Eat Less Often: White bread, white pasta, white rice, cornbread. Vegetables (4 to 5 servings daily)  Eat More Often: Fresh, frozen, and canned vegetables. Vegetables may be raw, steamed, roasted, or grilled with a minimal amount of fat.  Eat Less Often/Avoid: Creamed or fried vegetables. Vegetables in a cheese sauce. Fruit (4 to 5 servings daily)  Eat More Often: All fresh, canned  (in natural juice), or frozen fruits. Dried fruits without added sugar. One hundred percent fruit juice ( cup [237 mL] daily).  Eat Less Often: Dried fruits with added sugar. Canned fruit in light or heavy syrup. [employer], Fish, and Poultry (2 servings or less daily. One serving is 3 to 4 oz [85-114 g]).  Eat More Often: Ninety percent or leaner ground beef, tenderloin, sirloin. Round cuts of beef, chicken breast, [place] breast. All fish. Grill, bake, or broil your meat. Nothing should be fried.  Eat Less Often/Avoid: Fatty cuts of meat, [place], or chicken leg, thigh, or wing. Fried cuts of meat or fish. Dairy (2 to 3 servings)  Eat More Often: Low-fat or fat-free milk, low-fat plain or light yogurt, reduced-fat or part-skim cheese.  Eat Less Often/Avoid: Milk (whole, 2%).Whole milk yogurt. Full-fat cheeses. Nuts, Seeds, and Legumes (4 to 5 servings per week)  Eat More Often: All without added salt.  Eat Less Often/Avoid: Salted nuts and seeds, canned beans with added salt. Fats and Sweets (limited)  Eat More Often: Vegetable oils, tub margarines without trans fats, sugar-free gelatin. Mayonnaise and salad dressings.  Eat Less Often/Avoid: Coconut oils, palm oils, butter, stick margarine, cream, half and half, cookies, candy, pie. FOR MORE INFORMATION The Dash Diet Eating Plan: www.dashdiet.org Document Released: 06/10/2011 Document Revised: 09/13/2011 Document Reviewed: 06/10/2011 [facility] Patient Information 2014 [place], [place].

## 2012-12-15 ENCOUNTER — Encounter: Payer: Self-pay | Admitting: Family Medicine

## 2012-12-15 NOTE — Progress Notes (Signed)
  Subjective:    Patient ID: Joseph Singleton, male    DOB: April 11, 1966, 47 y.o.   MRN: [id number]  Hypertension    1. HTN f/u. Has been taking metoprolol BID. Stopped taking HCTZ, though we discussed continuing both meds. He doesn't check BP very often, but last time was 138/80s. He denies any edema, chest pian, visual changes, headaches, fatigue, syncope.   Nonsmoker.   Review of Systems See HPI otherwise negative.  reports that he quit smoking about 5 years ago. His smoking use included Cigarettes. He has a 10 pack-year smoking history. He does not have any smokeless tobacco history on file.     Objective:   Physical Exam  Vitals reviewed. Constitutional: He appears well-developed and well-nourished. No distress.  HENT:  Head: Normocephalic and atraumatic.  Cardiovascular: Normal rate, regular rhythm and normal heart sounds.   No murmur heard. Pulmonary/Chest: Effort normal and breath sounds normal. No respiratory distress. He has no wheezes. He has no rales.  Skin: He is not diaphoretic.        Assessment & Plan:

## 2012-12-15 NOTE — Assessment & Plan Note (Signed)
Above goal. Advised to restart HCTZ and follow up for BP check. We discussed importance of reaching goal < 140/90 to avoid organ damage in long term. He agrees to call with measurements or come for BP check next week. Refilled medications. F/u in 6 months for lab work.

## 2013-02-28 IMAGING — CT CT PELVIS W/ CM
2 of 3 series · 13 of 32 positions shown, 18 images · IV contrast (omnipaque)
Comparison: Pelvic CT - 03/23/2009

CLINICAL DATA: Possible right buttock abscess

CT PELVIS WITH CONTRAST
TECHNIQUE: Multidetector CT imaging of the pelvis was performed
using the standard protocol following the bolus administration of
intravenous contrast.
Contrast: 100mL OMNIPAQUE IOHEXOL 300 MG/ML IV SOLN

[Series 2: routine abdomen · axial · 0.98mm/px · z∈[-289,-59]mm · 5 of 70 slices shown, 10 images]
[im 12/70  soft-tissue]
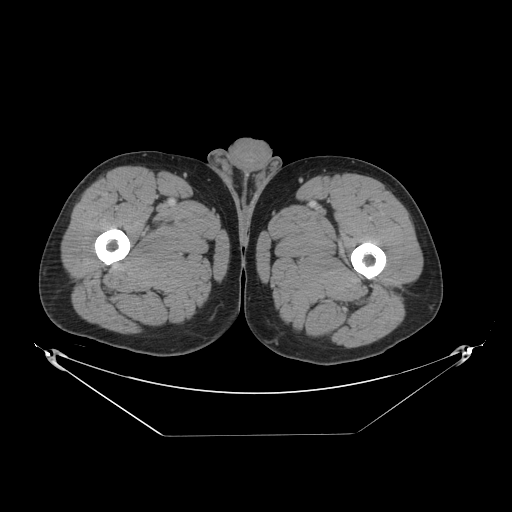
[im 12/70  bone]
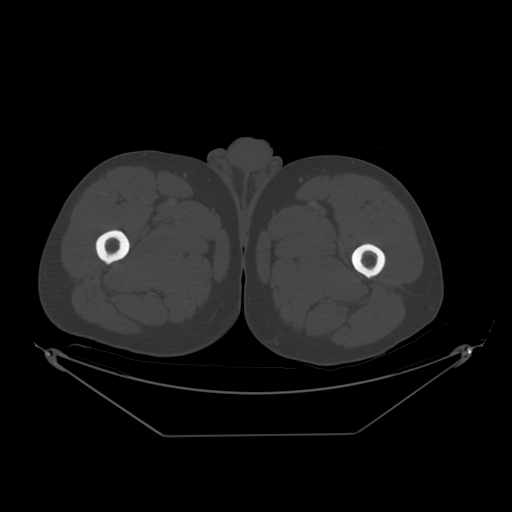
[im 24/70  soft-tissue]
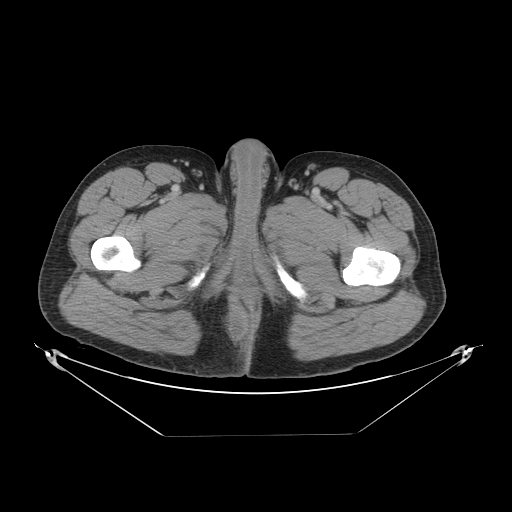
[im 24/70  lung]
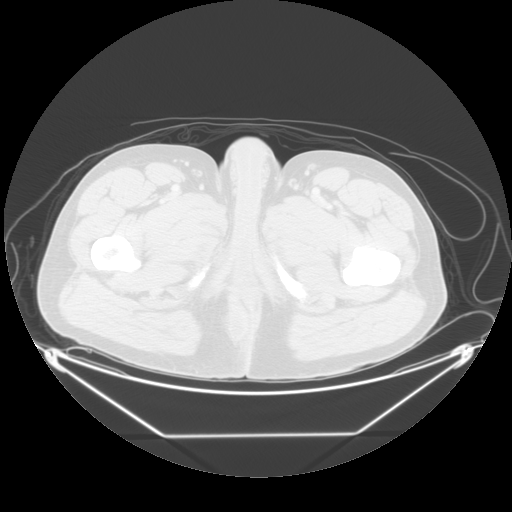
[im 35/70  soft-tissue]
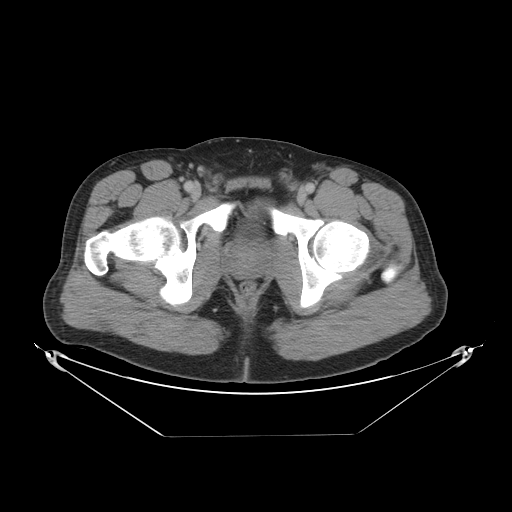
[im 35/70  lung]
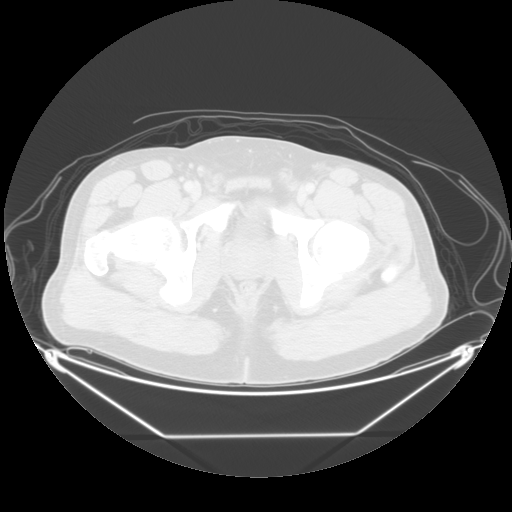
[im 47/70  soft-tissue]
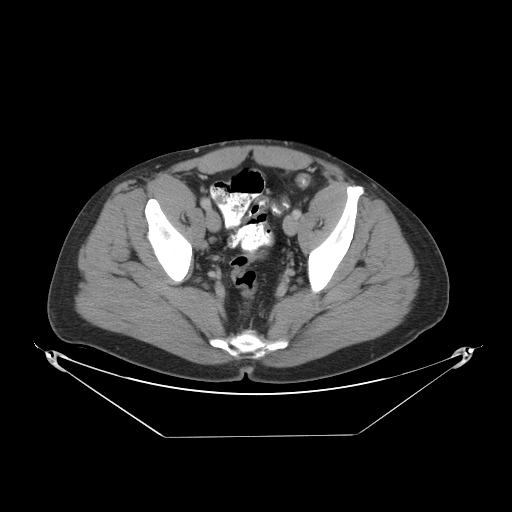
[im 47/70  lung]
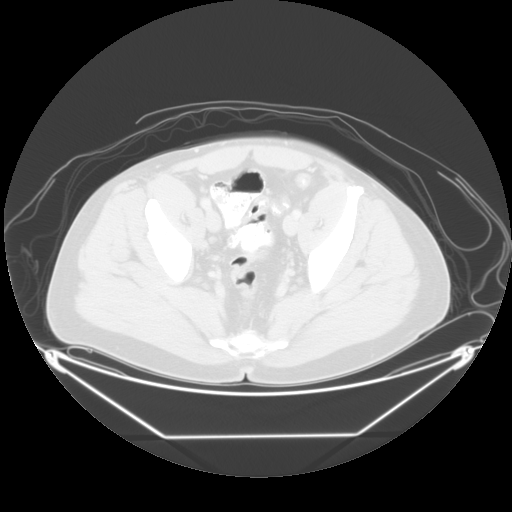
[im 58/70  soft-tissue]
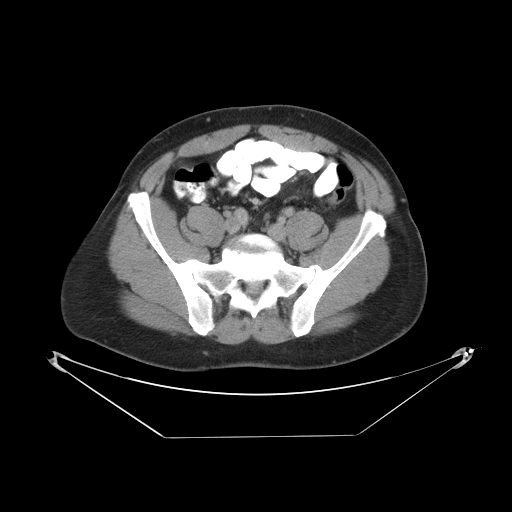
[im 58/70  lung]
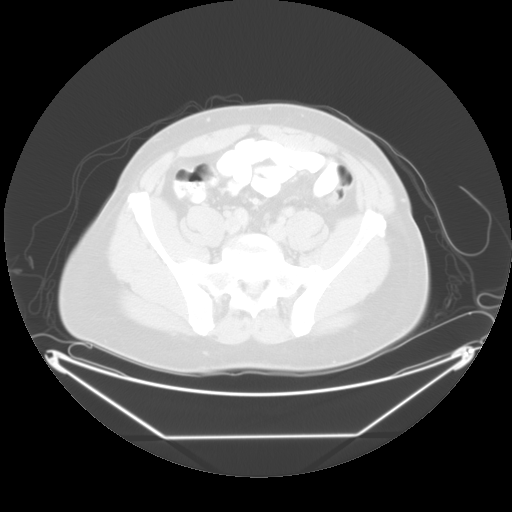

[Series 401: sag · sagittal · 0.98mm/px · 8 of 140 slices shown]
[im 10/140  soft-tissue]
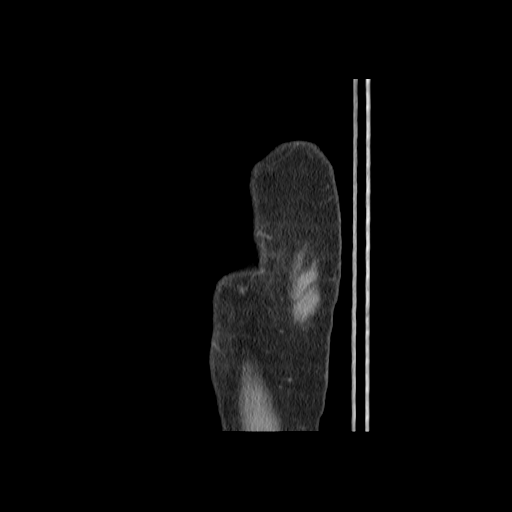
[im 30/140  soft-tissue]
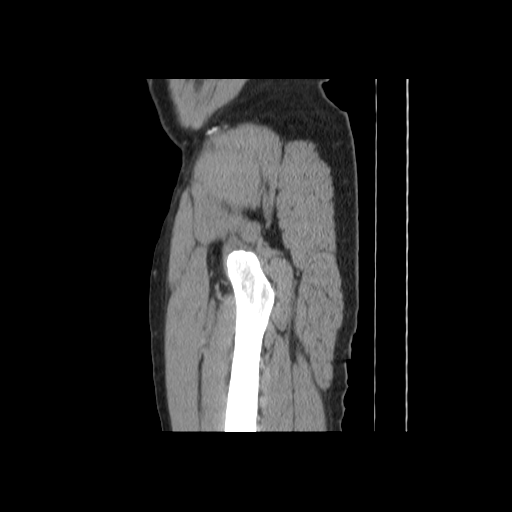
[im 50/140  soft-tissue]
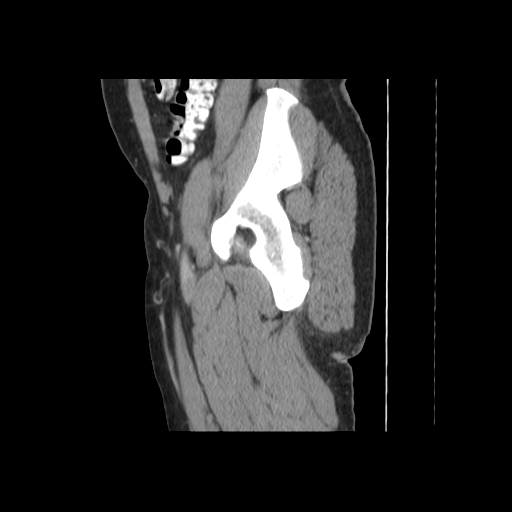
[im 60/140  soft-tissue]
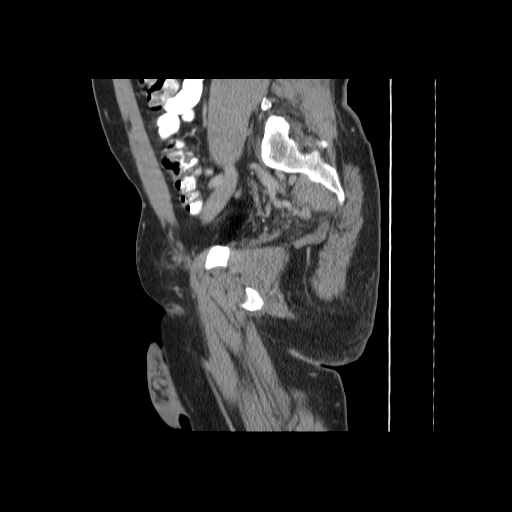
[im 80/140  soft-tissue]
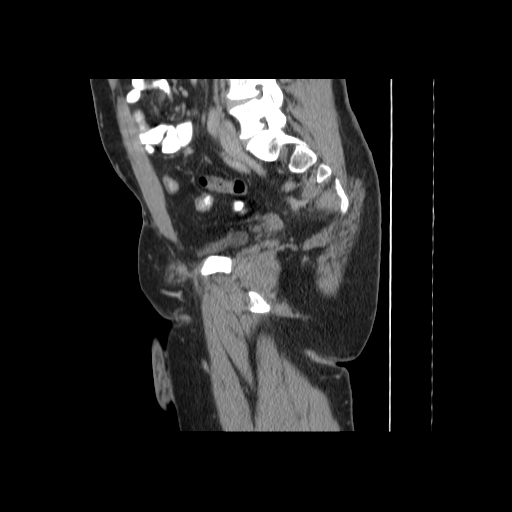
[im 90/140  soft-tissue]
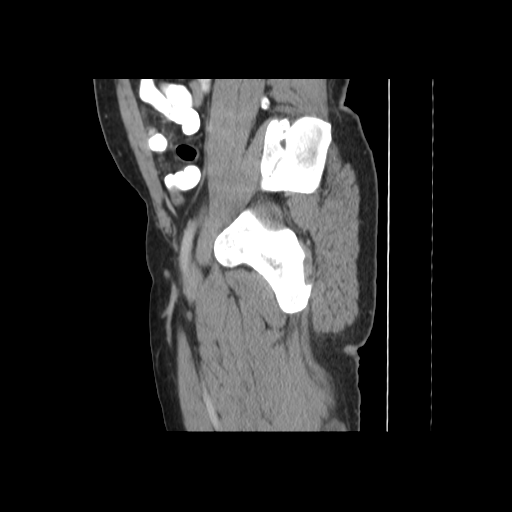
[im 110/140  soft-tissue]
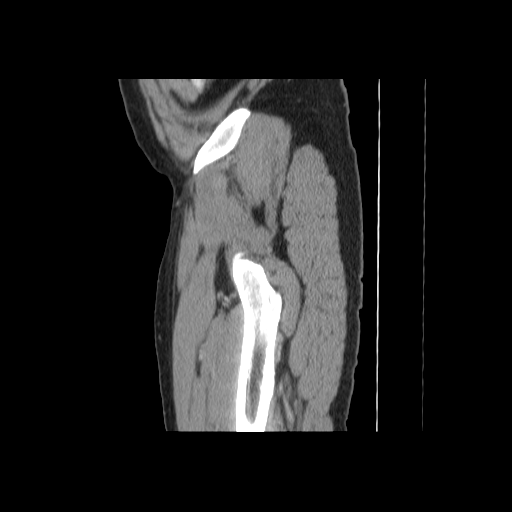
[im 130/140  soft-tissue]
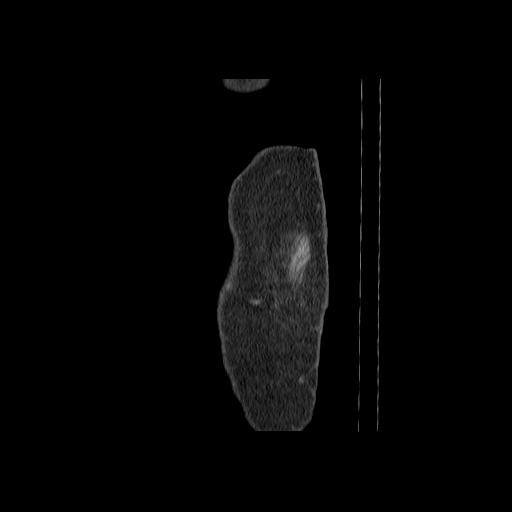

[13 of 32 positions shown; findings below may reference images not displayed]

FINDINGS: There is a recurrent approximately 3.0 x 1.9 cm peripherally
enhancing fluid collection within the posterior aspect of the right
buttocks (image 44, series 2).  The collection appears to
communicate with the adjacent skin possibly via a small sinus tract
(image 49).  There is expected adjacent surrounding subcutaneous
emphysema however no subcutaneous gas or radiopaque foreign body.
The inflammation does not appear to extend into the peritoneal
cavity or pelvic floor.  No definite involvement of the adjacent
rectum.  The visualized bowel loops are normal. Normal appendix.
Borderline enlarged right inguinal lymph node measuring
approximately 1.3 cm in greatest short axis diameter is likely
reactive.  No pelvic lymphadenopathy.  Regional osseous structures
are normal.
IMPRESSION: Recurrent right buttock abscess without definite intraperitoneal or
pelvic floor involvement.

## 2013-07-02 ENCOUNTER — Encounter: Payer: Self-pay | Admitting: Sports Medicine

## 2013-07-02 ENCOUNTER — Ambulatory Visit (INDEPENDENT_AMBULATORY_CARE_PROVIDER_SITE_OTHER): Payer: BC Managed Care – PPO | Admitting: Sports Medicine

## 2013-07-02 VITALS — BP 156/104 | HR 77 | Temp 99.8°F | Ht 74.5 in | Wt 220.0 lb

## 2013-07-02 DIAGNOSIS — Z1211 Encounter for screening for malignant neoplasm of colon: Secondary | ICD-10-CM | POA: Insufficient documentation

## 2013-07-02 DIAGNOSIS — Z299 Encounter for prophylactic measures, unspecified: Secondary | ICD-10-CM

## 2013-07-02 DIAGNOSIS — I10 Essential (primary) hypertension: Secondary | ICD-10-CM

## 2013-07-02 NOTE — Patient Instructions (Signed)
   Stop Metoprolol. 1/2 tablet twice a day for 5 days; then 1/2 tablet daily for 5 days.  Start new BP meds  Follow up for fasting labs including PSA    If you need anything prior to your next visit please call the clinic. Please Bring all medications or accurate medication list with you to each appointment; an accurate medication list is essential in providing you the best care possible.

## 2013-07-02 NOTE — Assessment & Plan Note (Signed)
No history of angioedema so will start ACE inhibitor with hydrochlorothiazide given difficulty in control. Titrate metoprolol off over the next 10 days.

## 2013-07-02 NOTE — Progress Notes (Signed)
  Aijalon Kirtz - 47 y.o. male MRN [id number]  Date of birth: [birth year]  CC, HPI, Interval History & ROS  Justice is here today for CPE and to followup on his chronic medical conditions including:  HTN   He reports overall seems to be doing well.  Not sure as to why his blood pressures as high as it is.  Reports being moderately active  Pt denies chest pain, dyspnea at rest or exertion, PND, lower extremity edema.  Patient denies any facial asymmetry, unilateral weakness, or dysarthria.  Questioning appropriateness of prostate cancer screening and other screening lab work.   No history of tachypalpitations  Denies any urinary symptoms or ED.  No melena, hematochezia.  No weight loss.  Nonsmoker  Other acute problems include: None  Pertinent History & Care Coordination  No Patient Care Coordination Note on file.  History  Smoking status  . Former Smoker -- 1.00 packs/day for 10 years  . Types: Cigarettes  . Quit date: 06/05/2007  Smokeless tobacco  . Not on file   Health Maintenance Due  Topic  . Influenza Vaccine    No results found for this basename: HGBA1C, TRIG, CHOL, HDL, LDLCALC, TSH,  in the last 8760 hours   Otherwise past Medical, Surgical, Social, and Family History Reviewed per EMR Medications and Allergies reviewed and all updated if necessary. Objective Findings  VITALS: HR: 77 bpm  BP: 156/104 mmHg  TEMP: 99.8 F (37.7 C) (Oral)  RESP:    HT: 6' 2.5" (189.2 cm)  WT: 220 lb (99.791 kg)  BMI: 27.9   BP Readings from Last 3 Encounters:  07/02/13 156/104  12/14/12 150/90  06/22/12 145/95   Wt Readings from Last 3 Encounters:  07/02/13 220 lb (99.791 kg)  12/14/12 224 lb (101.606 kg)  06/22/12 211 lb 9.6 oz (95.981 kg)     PHYSICAL EXAM: GENERAL:  adult well built [place] American male. In no discomfort; no respiratory distress  PSYCH: alert and appropriate, good insight   HNEENT:   CARDIO: RRR, S1/S2 heard, no murmur  LUNGS: CTA B, no wheezes,  no crackles  ABDOMEN:   EXTREM:  Warm, well perfused.  Moves all 4 extremities spontaneously; no lateralization.  No noted foot lesions.  Distal pulses 2+/4.  No pretibial edema.  GU:  rectal exam: External rectum normal, no hemorrhoids.  Prostate is flat, 2 fingerbreadths, no nodules   SKIN:      Assessment & Plan   Problems addressed today: General Plan & Pt Instructions:  1. HYPERTENSION   2. Preventive measure       Stop Metoprolol. 1/2 tablet twice a day for 5 days; then 1/2 tablet daily for 5 days.  Start new BP meds  Follow up for fasting labs including PSA     For further discussion of A/P and for follow up issues see problem based charting.

## 2013-07-02 NOTE — Assessment & Plan Note (Signed)
Screening lab work at next visit. Discussed need for screening colonoscopy at age 47. Spent extensive time in counseling the patient regarding PSA screening.  There is no family history or current urinary complaints however patient is aware of possibility of false positives with PSA screening and digital rectal exams and elects to proceed.  No nodules on exam; PSA at last check

## 2013-07-04 ENCOUNTER — Other Ambulatory Visit: Payer: BC Managed Care – PPO

## 2013-07-04 DIAGNOSIS — Z299 Encounter for prophylactic measures, unspecified: Secondary | ICD-10-CM

## 2013-07-04 LAB — COMPREHENSIVE METABOLIC PANEL
ALT: 29 U/L (ref 0–53)
CO2: 26 mEq/L (ref 19–32)
Calcium: 9.2 mg/dL (ref 8.4–10.5)
Chloride: 109 mEq/L (ref 96–112)
Creat: 1.1 mg/dL (ref 0.50–1.35)
Glucose, Bld: 87 mg/dL (ref 70–99)

## 2013-07-04 LAB — PSA: PSA: 1.55 ng/mL (ref <=4.00)

## 2013-07-04 LAB — LIPID PANEL
Cholesterol: 179 mg/dL (ref 0–200)
LDL Cholesterol: 120 mg/dL — ABNORMAL HIGH (ref 0–99)
Triglycerides: 107 mg/dL (ref <150)

## 2013-07-04 NOTE — Progress Notes (Signed)
CMP,FLP AND PSA DONE TODAY Joseph Singleton 

## 2013-07-09 ENCOUNTER — Encounter: Payer: Self-pay | Admitting: Sports Medicine

## 2013-07-09 ENCOUNTER — Telehealth: Payer: Self-pay | Admitting: Sports Medicine

## 2013-07-09 MED ORDER — LISINOPRIL-HYDROCHLOROTHIAZIDE 20-25 MG PO TABS: 1.0000 | ORAL_TABLET | Freq: Every day | ORAL | Status: DC

## 2013-07-09 NOTE — Telephone Encounter (Signed)
Patient was seen by Dr. Berline Choughigby on 07/02/13 and was suppose to be getting a new HTN meds. Nothing was ever called in. Wife would like meds to be called in. Please call patient once completed.

## 2013-07-09 NOTE — Telephone Encounter (Signed)
Rx sent in for Lisinopril/HCTZ

## 2013-07-31 ENCOUNTER — Ambulatory Visit (INDEPENDENT_AMBULATORY_CARE_PROVIDER_SITE_OTHER): Payer: BC Managed Care – PPO | Admitting: Family Medicine

## 2013-07-31 ENCOUNTER — Encounter: Payer: Self-pay | Admitting: Family Medicine

## 2013-07-31 VITALS — BP 131/85 | HR 89 | Temp 99.1°F | Ht 74.5 in | Wt 216.0 lb

## 2013-07-31 DIAGNOSIS — N529 Male erectile dysfunction, unspecified: Secondary | ICD-10-CM | POA: Insufficient documentation

## 2013-07-31 DIAGNOSIS — I10 Essential (primary) hypertension: Secondary | ICD-10-CM

## 2013-07-31 MED ORDER — HYDROCHLOROTHIAZIDE 25 MG PO TABS: 25.0000 mg | ORAL_TABLET | Freq: Every day | ORAL | Status: DC

## 2013-07-31 MED ORDER — AMLODIPINE BESYLATE 5 MG PO TABS: 5.0000 mg | ORAL_TABLET | Freq: Every day | ORAL | Status: DC

## 2013-07-31 NOTE — Patient Instructions (Signed)
Mr. Manson PasseyBrown, it was nice seeing you today.  Please stop taking your Zestoretic and start taking the HCTZ 25 mg one pill per day and the norvasc 5 mg one pill per day.  We will see you back in about 2 weeks and please let us know if your symptoms improve.  Thanks, Dr. Paulina FusiHess

## 2013-07-31 NOTE — Assessment & Plan Note (Addendum)
Pt states he has never had this problem before and started about 3-4 weeks ago.  No changes at that time other than starting Zestoretic and had previously been on HCTZ.  Pt states he is having trouble with both starting an erection and maintaining an erection.  No other changes in diet, exercise, travel, sexual partners.  He denies any penile discharge, dysuria, hematuria.  Does not smoke and occasional alcohol ( < 2-3 drinks per month).  DDx includes medication SE, pt A1C 5.4 one month ago so not diabetic, PAD however no known PAD and no other complaints, Prostate issue (PSA < 2, one month ago) no decreased testosterone evidence including gynecomastia, small testes, and no evidence of visual field defects.  Discussed options with pt and how BP is well controlled on his medication today w/o other SE including edema, cough, palpitations.  Discussed that it could be worth stopping this medication, restarting HCTZ at 25 mg, and starting norvasc 5 mg in AA pt with f/u in two weeks to see if there is an improvement.  If there is at that time, and considering renal protection secondary to possible early stage CKD, would consider ARB.

## 2013-07-31 NOTE — Progress Notes (Signed)
[name] is a 48 y/o male f/u for HTN c/o impotence.  Pt states he has never had this problem before and started about 3-4 weeks ago.  No changes at that time other than starting Zestoretic and had previously been on HCTZ.  Pt states he is having trouble with both starting an erection and maintaining an erection.  No other changes in diet, exercise, travel, sexual partners.  He denies any penile discharge, dysuria, hematuria.  Does not smoke and occasional alcohol ( < 2-3 drinks per month).  Past Medical History  Diagnosis Date  . Hypertension   . Perirectal abscess 06/07/2011    Required I&D, most recently 05/2011 in ED.    Marland Kitchen. Rhinitis medicamentosa 06/23/2012    History  Smoking status  . Former Smoker -- 1.00 packs/day for 10 years  . Types: Cigarettes  . Quit date: 06/05/2007  Smokeless tobacco  . Not on file    Family History  Problem Relation Age of Onset  . Heart disease Mother   . Hypertension Father   . Diabetes Father   . Cancer Maternal Aunt     breast  . Hypertension Brother   . Diabetes Brother   . Asthma Sister   . Lupus Sister     Current Outpatient Prescriptions on File Prior to Visit  Medication Sig Dispense Refill  . lisinopril-hydrochlorothiazide (PRINZIDE,ZESTORETIC) 20-25 MG per tablet Take 1 tablet by mouth daily.  90 tablet  3   No current facility-administered medications on file prior to visit.    ROS: Per HPI.  All other systems reviewed and are negative.   Physical Exam Filed Vitals:   07/31/13 1531  BP: 131/85  Pulse: 89  Temp: 99.1 F (37.3 C)    Physical Examination: General appearance - alert, well appearing, and in no distress Neck - No JVD, bruits B/L  Chest - clear to auscultation, no wheezes, rales or rhonchi, symmetric air entry Heart - normal rate and regular rhythm, no murmurs noted    Chemistry      Component Value Date/Time   NA 138 07/04/2013 0833   K 4.4 07/04/2013 0833   CL 109 07/04/2013 0833   CO2 26 07/04/2013  0833   BUN 12 07/04/2013 0833   CREATININE 1.10 07/04/2013 0833   CREATININE 1.02 05/07/2011 1326      Component Value Date/Time   CALCIUM 9.2 07/04/2013 0833   ALKPHOS 70 07/04/2013 0833   AST 22 07/04/2013 0833   ALT 29 07/04/2013 0833   BILITOT 0.5 07/04/2013 0833     Lab Results  Component Value Date   HGBA1C 5.4 06/22/2012

## 2013-07-31 NOTE — Assessment & Plan Note (Signed)
Will switch to Norvasc 5 mg with HCTZ 25 mg and f/u in two weeks.

## 2013-10-15 ENCOUNTER — Other Ambulatory Visit: Payer: Self-pay | Admitting: Family Medicine

## 2013-11-19 ENCOUNTER — Other Ambulatory Visit: Payer: Self-pay | Admitting: Sports Medicine

## 2014-03-29 ENCOUNTER — Other Ambulatory Visit: Payer: Self-pay | Admitting: *Deleted

## 2014-03-29 ENCOUNTER — Telehealth: Payer: Self-pay | Admitting: Family Medicine

## 2014-03-29 MED ORDER — AMLODIPINE BESYLATE 5 MG PO TABS: 5.0000 mg | ORAL_TABLET | Freq: Every day | ORAL | Status: DC

## 2014-03-29 MED ORDER — HYDROCHLOROTHIAZIDE 25 MG PO TABS: 25.0000 mg | ORAL_TABLET | Freq: Every day | ORAL | Status: DC

## 2014-03-29 NOTE — Telephone Encounter (Signed)
I have received a prescription request for Hypertensive Medications. I will call in meds with 1 refill on each. He needs to make an appointment for future refills. I am his new PCP and have not been able to evaluate/meet him. Thanks

## 2014-04-19 ENCOUNTER — Other Ambulatory Visit: Payer: Self-pay

## 2014-07-22 ENCOUNTER — Encounter: Payer: Self-pay | Admitting: Family Medicine

## 2014-07-22 ENCOUNTER — Ambulatory Visit (INDEPENDENT_AMBULATORY_CARE_PROVIDER_SITE_OTHER): Payer: BLUE CROSS/BLUE SHIELD | Admitting: Family Medicine

## 2014-07-22 ENCOUNTER — Other Ambulatory Visit: Payer: Self-pay | Admitting: Family Medicine

## 2014-07-22 VITALS — BP 182/110 | HR 86 | Temp 97.7°F | Wt 215.9 lb

## 2014-07-22 DIAGNOSIS — M25562 Pain in left knee: Secondary | ICD-10-CM | POA: Diagnosis not present

## 2014-07-22 DIAGNOSIS — S8992XA Unspecified injury of left lower leg, initial encounter: Secondary | ICD-10-CM | POA: Insufficient documentation

## 2014-07-22 MED ORDER — AMLODIPINE BESYLATE 5 MG PO TABS: 5.0000 mg | ORAL_TABLET | Freq: Every day | ORAL | Status: DC

## 2014-07-22 MED ORDER — MELOXICAM 15 MG PO TABS: 15.0000 mg | ORAL_TABLET | Freq: Every day | ORAL | Status: DC

## 2014-07-22 MED ORDER — METHYLPREDNISOLONE ACETATE 80 MG/ML IJ SUSP
80.0000 mg | Freq: Once | INTRAMUSCULAR | Status: AC
  Administered 2014-07-22: 80 mg via INTRA_ARTICULAR

## 2014-07-22 MED ORDER — HYDROCHLOROTHIAZIDE 25 MG PO TABS: 25.0000 mg | ORAL_TABLET | Freq: Every day | ORAL | Status: DC

## 2014-07-22 MED ORDER — HYDROCODONE-ACETAMINOPHEN 5-325 MG PO TABS: 1.0000 | ORAL_TABLET | Freq: Four times a day (QID) | ORAL | Status: DC | PRN

## 2014-07-22 NOTE — Assessment & Plan Note (Signed)
Concern for acute meniscal tear.  Bulging of medial meniscus on US today - Obtain Knee x-ray to evaluate for patella and tibial plateau fracture.  Dobut this is etiology.  - Could have concurrent ACL injury but hard to evaluate today due to guarding and effusion - Start Mobic 15 mg daily, vicodin PRN for pain, compression, ice and elevation - Off work for this week due to his job description - F/U in 2 weeks to see how doing.  If at that time, doing better and w/o mechanical Sx, PT would be a good option for him.  However, if any concerns of ongoing process (ACL or mechanical Sx), an MRI would be a reasonable test at that time.   Attempted to aspirate L knee today with 5 cc lidocaine w/ 21 1 1/2 needle to the superiorlateral L knee.  However, under US, was unable to obtain fluid.  Pt had significant pain despite anesthesia, so this was stopped.  Area was cleaned with betadine x 3 prior to aspiration attempt with a 18 gauge needle and 60 cc syringe.

## 2014-07-22 NOTE — Patient Instructions (Signed)

## 2014-07-22 NOTE — Progress Notes (Signed)
Joseph Singleton is a 49 y.o. male who presents today for L knee pain and effusion.  L knee injury - Pt was playing basketball two days ago when he went to pivot and had immediate pain in the L knee causing him to be unable to move at that time.   Denied immediate swelling at that time but was limping obviously.  Denies previous injury to the L knee and is R leg dominant.  Upon arriving home, he sat down and when he went to stand up, he had severe effusion.  He has tried tylenol with some relief but otherwise has not done anything.  Unable to fully flex or extend his knee due to pain and effusion.  Denies locking catching or instability but is worse when trying to go up the stairs.  Knee has not given out on him at this time, but walking with cane due to pain.    Past Medical History  Diagnosis Date  . Hypertension   . Perirectal abscess 06/07/2011    Required I&D, most recently 05/2011 in ED.    Marland Kitchen Rhinitis medicamentosa 06/23/2012    History  Smoking status  . Former Smoker -- 1.00 packs/day for 10 years  . Types: Cigarettes  . Quit date: 06/05/2007  Smokeless tobacco  . Not on file    Family History  Problem Relation Age of Onset  . Heart disease Mother   . Hypertension Father   . Diabetes Father   . Cancer Maternal Aunt     breast  . Hypertension Brother   . Diabetes Brother   . Asthma Sister   . Lupus Sister     Current Outpatient Prescriptions on File Prior to Visit  Medication Sig Dispense Refill  . amLODipine (NORVASC) 5 MG tablet Take 1 tablet (5 mg total) by mouth daily. 30 tablet 1  . hydrochlorothiazide (HYDRODIURIL) 25 MG tablet Take 1 tablet (25 mg total) by mouth daily. 30 tablet 1   No current facility-administered medications on file prior to visit.    ROS: Per HPI.  All other systems reviewed and are negative.   Physical Exam Filed Vitals:   07/22/14 0947  BP: 182/110  Pulse: 86  Temp: 97.7 F (36.5 C)   Gen: Well NAD Cardio: RRR, No M/G/R Pulm:  CTAB L Knee:  Exam: General (compare with less affected knee)  1. Observation  1. Ecchymosis No. 2. Knee Effusion + on L, 2+  3. Previous surgical scars No. 4. Knee resting position 5. Quadriceps muscle atrophy  1. Evaluate Vastus Medialis Obliquus specifically No. 2. Tenderness to Palpation  1. Patella No. 2. Tibial tubercle No. 3. Patellar tendon No. 4. Quadriceps tendon No. 5. Joint line  1. Medial  Yes.   2. Lateral No. 3. Normal Range of Motion  1. Flexion: 90 degrees 2. Extension: 10 degrees Exam: Patellofemoral - Negative patellar grind and glide  1. Patellar Apprehension Test No. Exam: Anterior Cruciate Ligament (ACL) - Unable to perform anterior drawer, Pivot/Shift, Lachman due to guarding  Exam: Posterior Cruciate Ligament (PCL) Tests - Negative  Exam: Collateral ligament evaluation  1. Knee Valgus Stress Test (Medial collateral ligament) No. 2. Knee Varus Stress Test (Lateral collateral ligament) No. Exam: Meniscus Evaluation  1. McMurray's Test Yes.   2. Apley's Compression Test and Apley's Distraction Test No. 3. Joint line tendernessYes.   4. + Thessaly L   Neurovascular Status - Intact B/L LE    [place] L knee - Obvious suprapatellar effusion.  [employer]  and patellar tendon intact w/o deformity.  Medial meniscus with bulging and hypoechoic region.

## 2014-08-08 ENCOUNTER — Ambulatory Visit: Payer: BLUE CROSS/BLUE SHIELD | Admitting: Family Medicine

## 2014-08-09 ENCOUNTER — Encounter: Payer: Self-pay | Admitting: Family Medicine

## 2014-08-09 ENCOUNTER — Ambulatory Visit (INDEPENDENT_AMBULATORY_CARE_PROVIDER_SITE_OTHER): Payer: BLUE CROSS/BLUE SHIELD | Admitting: Family Medicine

## 2014-08-09 VITALS — BP 156/90 | HR 97 | Temp 98.1°F | Ht 74.5 in | Wt 215.9 lb

## 2014-08-09 DIAGNOSIS — S8992XD Unspecified injury of left lower leg, subsequent encounter: Secondary | ICD-10-CM

## 2014-08-09 DIAGNOSIS — M25562 Pain in left knee: Secondary | ICD-10-CM

## 2014-08-09 NOTE — Assessment & Plan Note (Signed)
Patient with continued pain and significantly decreased range of motion. Patient also with symptoms of instability. As x-ray was not obtained at last visit, will order complete film of the left knee. I suspect the patient may need an MRI for further evaluation (or at least initially a comprehensive/complete ultrasound). Will refer to sports medicine for further evaluation/comprehensive US.

## 2014-08-09 NOTE — Patient Instructions (Addendum)
It was nice to see you today.  Please go get the xray as soon as possible.  I have placed a referral to sports medicine.  They will call you asap.   Sports Medicine [address] [place], [place] [address] Phone: [phone]  Take care  Dr. Adriana Simasook

## 2014-08-09 NOTE — Progress Notes (Signed)
   Subjective:    Patient ID: Joseph Singleton, male    DOB: 05/19/1966, 49 y.o.   MRN: [id number]  HPI 49 year old male presents for follow-up regarding a recent left knee injury.  1) Left knee injury/pain  Patient was seen on 1/18 after suffering a twisting/pivot injury of his left knee while playing a game of basketball 2 days prior.  Patient was seen and evaluated by Dr. Paulina FusiHess.  Ultrasound was performed and revealed a large effusion and medial meniscus bulging.  Aspiration was attempted but was unsuccessful. Depo-Medrol injection was performed.  After patient's visit, x-ray was not obtained.  He presents today for follow-up.  He reports continued left knee pain. Pain is currently 6 out of 10 in severity.  Pain is located on the medial, lateral, and superior aspects.  Pain is worse with physical activity. He has returned back to work and is struggling to complete his tasks at work.  Patient also reports some continued swelling.  Patient also notes popping, clicking, and feelings of instability.  He has been taking for described medications for pain with improvement.  Review of Systems Per HPI    Objective:   Physical Exam Filed Vitals:   08/09/14 1542  BP: 156/90  Pulse: 97  Temp: 98.1 F (36.7 C)   Exam: General: well appearing, NAD. Knee: Left Inspection - mild effusion noted. No redness. No obvious deformity. Palpation with medial and lateral joint line tenderness. ROM significantly limited. Patient is only able to flex approximately 30.   Ligaments with solid consistent endpoints including ACL, PCL, LCL and MCL. Unable to perform McMurray's secondary to decreased range of motion and pain. Patellar and quadriceps tendons unremarkable.    Assessment & Plan:  See Problem List

## 2014-08-13 ENCOUNTER — Telehealth: Payer: Self-pay | Admitting: Family Medicine

## 2014-08-13 NOTE — Telephone Encounter (Signed)
When and where was pt suppose to have Xray? He has to back to work 2-15 . He needs to be out another week or so so he can go to the dr Please advise

## 2014-08-13 NOTE — Telephone Encounter (Signed)
LVM for patient to call back to inform him that X-Ray is done by walk in, [facility] Radiology 1st floor.

## 2014-08-14 ENCOUNTER — Ambulatory Visit (HOSPITAL_COMMUNITY)
Admission: RE | Admit: 2014-08-14 | Discharge: 2014-08-14 | Disposition: A | Discharge status: Home / Self Care | Payer: BLUE CROSS/BLUE SHIELD | Source: Ambulatory Visit | Attending: Family Medicine | Admitting: Family Medicine

## 2014-08-14 DIAGNOSIS — M179 Osteoarthritis of knee, unspecified: Secondary | ICD-10-CM | POA: Insufficient documentation

## 2014-08-14 DIAGNOSIS — M25562 Pain in left knee: Secondary | ICD-10-CM | POA: Diagnosis present

## 2014-08-14 DIAGNOSIS — Y939 Activity, unspecified: Secondary | ICD-10-CM | POA: Insufficient documentation

## 2014-08-15 ENCOUNTER — Ambulatory Visit (INDEPENDENT_AMBULATORY_CARE_PROVIDER_SITE_OTHER): Payer: BLUE CROSS/BLUE SHIELD | Admitting: Family Medicine

## 2014-08-15 ENCOUNTER — Encounter: Payer: Self-pay | Admitting: Family Medicine

## 2014-08-15 VITALS — BP 150/97 | HR 92 | Temp 98.5°F | Ht 74.5 in | Wt 215.5 lb

## 2014-08-15 DIAGNOSIS — I10 Essential (primary) hypertension: Secondary | ICD-10-CM

## 2014-08-15 DIAGNOSIS — Z418 Encounter for other procedures for purposes other than remedying health state: Secondary | ICD-10-CM

## 2014-08-15 DIAGNOSIS — Z299 Encounter for prophylactic measures, unspecified: Secondary | ICD-10-CM

## 2014-08-15 LAB — COMPREHENSIVE METABOLIC PANEL
ALK PHOS: 93 U/L (ref 39–117)
ALT: 31 U/L (ref 0–53)
AST: 23 U/L (ref 0–37)
Albumin: 4.5 g/dL (ref 3.5–5.2)
BILIRUBIN TOTAL: 0.3 mg/dL (ref 0.2–1.2)
BUN: 22 mg/dL (ref 6–23)
CO2: 27 mEq/L (ref 19–32)
Calcium: 9.6 mg/dL (ref 8.4–10.5)
Chloride: 103 mEq/L (ref 96–112)
Creat: 1.39 mg/dL — ABNORMAL HIGH (ref 0.50–1.35)
Glucose, Bld: 99 mg/dL (ref 70–99)
Potassium: 3.8 mEq/L (ref 3.5–5.3)
SODIUM: 138 meq/L (ref 135–145)
Total Protein: 7.5 g/dL (ref 6.0–8.3)

## 2014-08-15 LAB — LDL CHOLESTEROL, DIRECT: LDL DIRECT: 105 mg/dL — AB

## 2014-08-15 LAB — POCT GLYCOSYLATED HEMOGLOBIN (HGB A1C): Hemoglobin A1C: 5.3

## 2014-08-15 MED ORDER — AMLODIPINE BESYLATE 5 MG PO TABS: 5.0000 mg | ORAL_TABLET | Freq: Every day | ORAL | Status: DC

## 2014-08-15 MED ORDER — HYDROCHLOROTHIAZIDE 25 MG PO TABS: 25.0000 mg | ORAL_TABLET | Freq: Two times a day (BID) | ORAL | Status: DC

## 2014-08-15 NOTE — Patient Instructions (Signed)
I'm going to increase her HCTZ to 25 mg twice a day. We will need to follow-up with you in one week for nurse visit, to check your blood pressure. He may eventually need to increase her Norvasc as well. I would like you to start thinking about your colonoscopy, and screening within the next few months. We will complete blood work today, including cholesterol and we will call you once the results become available.

## 2014-08-16 LAB — CBC WITH DIFFERENTIAL/PLATELET
BASOS ABS: 0.1 10*3/uL (ref 0.0–0.1)
BASOS PCT: 1 % (ref 0–1)
Eosinophils Absolute: 0.2 10*3/uL (ref 0.0–0.7)
Eosinophils Relative: 4 % (ref 0–5)
HCT: 42.5 % (ref 39.0–52.0)
HEMOGLOBIN: 14.4 g/dL (ref 13.0–17.0)
LYMPHS ABS: 2.1 10*3/uL (ref 0.7–4.0)
Lymphocytes Relative: 36 % (ref 12–46)
MCH: 30.2 pg (ref 26.0–34.0)
MCHC: 33.9 g/dL (ref 30.0–36.0)
MCV: 89.1 fL (ref 78.0–100.0)
MONOS PCT: 10 % (ref 3–12)
MPV: 13.1 fL — ABNORMAL HIGH (ref 8.6–12.4)
Monocytes Absolute: 0.6 10*3/uL (ref 0.1–1.0)
NEUTROS PCT: 49 % (ref 43–77)
Neutro Abs: 2.8 10*3/uL (ref 1.7–7.7)
Platelets: 153 10*3/uL (ref 150–400)
RBC: 4.77 MIL/uL (ref 4.22–5.81)
RDW: 14.1 % (ref 11.5–15.5)
WBC: 5.7 10*3/uL (ref 4.0–10.5)

## 2014-08-16 LAB — PSA: PSA: 4 ng/mL (ref <=4.00)

## 2014-08-19 NOTE — Assessment & Plan Note (Signed)
Declines flu. Encouraged to schedule colonoscopy.  PSA today Will f/u with results.

## 2014-08-19 NOTE — Progress Notes (Addendum)
   Subjective:    Patient ID: Joseph Singleton, male    DOB: 05/30/1966, 49 y.o.   MRN: [id number]  HPI  [facility]. Patient has no complaints. He is healing from a new knee injury and has an appointment set up with sports medicine next week. He is taking vicodin and mobic for acute injury.   Hypertension: pt currently takes norvasc 5 mg daily and HCTZ QD. He denies chest pain, shortness of breath, leg edema, headaches or changes in vision.  He does not watch his diet. He does usually get a fair amount exercise with basketball prior to recent knee injury. His blood pressure has been elevated the past 6 OV.  Pt reports no difficulty with urination. He has elected to have DRE yearly on passed exams. He is asking for one today. Pts PSA has not been elevated in the past and there is no family history of colon or prostate cancer.   Health maintenance: No colonoscopy prior. Declines flu. Tdap UTD .  Former smoker  Past Medical History  Diagnosis Date  . Hypertension   . Perirectal abscess 06/07/2011    Required I&D, most recently 05/2011 in ED.    Marland Kitchen. Rhinitis medicamentosa 06/23/2012   No Known Allergies   Review of Systems Per HPI    Objective:   Physical Exam BP 150/97 mmHg  Pulse 92  Temp(Src) 98.5 F (36.9 C) (Oral)  Ht 6' 2.5" (1.892 m)  Wt 215 lb 8 oz (97.75 kg)  BMI 27.31 kg/m2 Gen: NAD. Well developed, well nourished. Nontoxic. Alert, oriented.  HEENT: AT. [place]. Bilateral TM visualized and normal in appearance. Bilateral eyes without injections or icterus. MMM. Bilateral nares without erythema or swelling. Throat without erythema or exudates.  CV: RRR, no murmur appreciated.  Chest: CTAB, no wheeze or crackles Abd: Soft. Flat. NTND. BS present. No Masses palpated.  Ext: No erythema. No edema. +2/4 PT. Knee stabilizer in place.  GU: prostate smooth and normal size.  Skin: No rashes, purpura or petechiae. Intact.  Neuro: Limp (acute injury) PERLA.  EOMi. Alert. Grossly intact.  Psych: Normal speech, mood and affect.     Assessment & Plan:

## 2014-08-19 NOTE — Assessment & Plan Note (Signed)
-   increase hctz to BID today, kept norvasc at 5 mg QD - CBC, CMP, A1c, lipids - f/u 2 weeks

## 2014-08-20 ENCOUNTER — Telehealth: Payer: Self-pay | Admitting: Family Medicine

## 2014-08-20 ENCOUNTER — Encounter: Payer: Self-pay | Admitting: Family Medicine

## 2014-08-20 DIAGNOSIS — R972 Elevated prostate specific antigen [PSA]: Secondary | ICD-10-CM

## 2014-08-20 NOTE — Telephone Encounter (Signed)
Call patient and discussed the PSA results, along with all of his other lab results with him today. - PSA 4, this is double from his prior PSA 1 year ago. Will order a free PSA, if low for age will consider urology appointment for him. - Advised patient he would benefit from eating a  little healthier, LDL 105. Encourage fresh fruits and vegetables, no deep-fried foods, lean meats. - Patient with mild AKI, possibly due from increased NSAID use with recent knee injury. Advised patient to drink plenty of water, and only use NSAIDs as needed. Will repeat lab on next appointment to make certain it has resolved. Kuneff, Renee DO

## 2014-08-21 ENCOUNTER — Encounter: Payer: Self-pay | Admitting: Sports Medicine

## 2014-08-21 ENCOUNTER — Ambulatory Visit (INDEPENDENT_AMBULATORY_CARE_PROVIDER_SITE_OTHER): Payer: BLUE CROSS/BLUE SHIELD | Admitting: Sports Medicine

## 2014-08-21 ENCOUNTER — Other Ambulatory Visit: Payer: BLUE CROSS/BLUE SHIELD

## 2014-08-21 VITALS — BP 146/86 | Ht 75.0 in | Wt 215.0 lb

## 2014-08-21 DIAGNOSIS — N179 Acute kidney failure, unspecified: Secondary | ICD-10-CM

## 2014-08-21 DIAGNOSIS — R972 Elevated prostate specific antigen [PSA]: Secondary | ICD-10-CM

## 2014-08-21 DIAGNOSIS — M25462 Effusion, left knee: Secondary | ICD-10-CM

## 2014-08-21 DIAGNOSIS — S8992XD Unspecified injury of left lower leg, subsequent encounter: Secondary | ICD-10-CM

## 2014-08-21 MED ORDER — KETOROLAC TROMETHAMINE 60 MG/2ML IM SOLN
60.0000 mg | Freq: Once | INTRAMUSCULAR | Status: AC
  Administered 2014-08-21: 60 mg via INTRAMUSCULAR

## 2014-08-21 NOTE — Progress Notes (Signed)
Joseph CiscoKevin Singleton - 49 y.o. male MRN [id number]  Date of birth: 08/30/1965  SUBJECTIVE: CC: Left knee pain, initial evaluation HPI: Injury on 07/22/2014. Reporting twisting mechanism playing basketball with his son.   Prior left knee issues seen by Dr. Althea CharonMcKinley. MRI reviewed as below.   s/p attempted aspiration and injection by Dr. Paulina FusiHess on 07/22/2014.  Recent mild AKI questionable from NSAID use.  Reports occasional clicking. No significant instability.  Moderate swelling.  Does not affect him at night and he is able to sleep comfortably. Declines pain medication.  Out of work at this time. Works in a factory and stands. Would like to return to work.  ROS: per HPI  HISTORY:  Past Medical, Surgical, Social, and Family History reviewed & updated per EMR.  Pertinent Historical Findings include:  reports that he quit smoking about 7 years ago. His smoking use included Cigarettes. He has a 10 pack-year smoking history. He does not have any smokeless tobacco history on file. Hypertension, recently elevated PSA.   Historical Data Reviewed: X-ray 08/14/2014: nonweightbearing films, Mild OA changes of the left knee, . No acute bony abnormality.  MRI 12/25/2012: Ordered by Dr. Althea CharonMcKinley:  #1 Radial tear posterior horn near the meniscal root #2 Grade 1 MCL sprain. #3 medial compartment and femorals: Clear groove degenerative chondral thinning #4 Moderate effusion with Baker's cyst   OBJECTIVE:  VS:   HT:6\' 3"  (190.5 cm)   WT:215 lb (97.523 kg)  BMI:26.9          BP:(!) 146/86 mmHg  HR: bpm  TEMP: ( )  RESP:   PHYSICAL EXAM:  GENERAL: Adult African-American. No acute distress PSYCH: Alert and appropriately interactive. VASCULAR: Lower extremity pulses 2+/4, no significant pretibial edema NEURO: Lower extremity strength is 5+/5 in all myotomes; sensation is intact to light touch in all dermatomes. LEFT KNEE: Overall well aligned, Moderate effusion with synovitis. Hypopigmented area over  superior lateral aspect of knee consistent with ethyl chloride use  ROM: 0-120, painful at terminal flexion and extension  Ligaments: Stable to varus and valgus strain anterior posterior drawer, solid Lachman's.  Meniscal testing: Slight pain with McMurray's.  TTP over medial and lateral joint line, pes bursa.  Small Baker's cyst appreciated  DATA OBTAINED DURING VISIT: LIMITED MSK ULTRASOUND OF LEFT KNEE: Findings:  Small effusion, loculated, medial meniscus appears intact at the midline with minimal extrusion. Impression: Small loculated effusion with likely degenerative meniscal tear   ASSESSMENT: 1. Left knee injury, subsequent encounter   2. Acute kidney injury   3. Knee effusion, left    No problems updated.    PROCEDURES: PROCEDURE NOTE : Ultrasound Guided left knee aspiration and injection The risks, benefits and expected outcomes of the injection were reviewed and he wishes to undergo the above named procedure.  Written consent was obtained. After an appropriate time out was taken, the ultrasound was used to identify the target structure and adjacent vascular structures. The left knee was then prepped in a sterile fashion using alcohol and a skin wheal and anesthetized track was obtained using 3 cc of 1% lidocaine on a 27-gauge 1-1/2 inch needle via the superior lateral portal.  The area was cleaned again with alcohol and care was taken to keep the ultrasound probe and jelly away from the sterile field. Under direct visualization with real time Ultrasound guidance the target structure was aspirated and injected as below:             Needle:  18-gauge Aspirate: 10cc blood-tinged  straw-colored synovial fluid                Meds:  3 mL of 1% lidocaine, 60 mL of Toradol             Images: Obtained A bandaid was applied to the area. 6 inch Ace wrap applied to knee. This procedure was well tolerated and there were no complications.   PLAN: See problem based charting & AVS for  additional documentation.  No meniscal and degenerative changes within the knee. Previously doing well until this acute injury. Discussed conservative care with return to work versus obtaining further advanced imaging with MRI. Patient would like a trial of seeing how he does with repeat injection today. He did have improvement following aspiration and Toradol injection.   Needs to avoid systemic NSAID use due to acute kidney injury.  If not improving after return to work on Monday will need MRI and likely surgical referral  > Patient will call and touch base first part of next week and let us know how he is feeling. If persistent pain, and symptoms we'll set up for repeat MRI

## 2014-08-21 NOTE — Progress Notes (Signed)
[facility] phlebotomist drew:  Free PSA

## 2014-08-22 LAB — PSA, FREE
PSA, Free Pct: 16 % — ABNORMAL LOW (ref >25)
PSA, Free: 0.44 ng/mL

## 2014-08-22 LAB — PSA: PSA: 2.74 ng/mL (ref <=4.00)

## 2014-08-23 ENCOUNTER — Telehealth: Payer: Self-pay | Admitting: Family Medicine

## 2014-08-23 DIAGNOSIS — R972 Elevated prostate specific antigen [PSA]: Secondary | ICD-10-CM

## 2014-08-23 NOTE — Telephone Encounter (Signed)
Pt informed of low free psa and a referral will be placed to a urologist.  Pt told he would get a phone call once the referral is placed from [facility] or the urologist office with an appt.  Pt stated understanding.  No questions at this time.  Clovis PuMartin, Malisha Mabey L, RN

## 2014-08-23 NOTE — Telephone Encounter (Signed)
I would like to send [name] to Urology since he has had a double in PSA over a year period and low free PSA. I had spoken with him prior about needing the free psa and then likely sending him to urology, so he is aware that this is a possibility.  Considering he has not had problems with urination and he has no family history of prostate cancer this referral is to get an expert opinion and continuation of monitoring if necessary. Thank you.  RN Please call pt and make him aware of referral/low free psa (which is a little more concerning but not a diagnosis). I will send to [facility] white pool to place referral.

## 2014-09-03 NOTE — Telephone Encounter (Signed)
Mrs. Joseph Singleton calling about husband's form regarding his FMLA.  Please advise as to whether document has been completed and disposition.  Contact pt when completed

## 2014-09-10 NOTE — Telephone Encounter (Signed)
Patient's wife came by and picked these up last week, Dr. Paulina FusiHess signed

## 2014-09-10 NOTE — Telephone Encounter (Signed)
I was out last week, but I do not have FMLA papers in my box for [name]. I will be happy to complete them, if they had not been done already, once I have them. Thanks.

## 2015-02-13 ENCOUNTER — Other Ambulatory Visit: Payer: Self-pay | Admitting: Family Medicine

## 2015-06-05 ENCOUNTER — Other Ambulatory Visit: Payer: Self-pay | Admitting: Family Medicine

## 2015-06-05 NOTE — Telephone Encounter (Signed)
This is your pt, see med refill 

## 2015-06-11 NOTE — Telephone Encounter (Signed)
Hi sorry I can't see which prescription was requested from this patient for some reason. Thanks, Dr. Earlene PlaterWallace

## 2015-06-12 NOTE — Telephone Encounter (Signed)
This refill was for hydrochlorothiazide 25mg 

## 2015-08-14 ENCOUNTER — Other Ambulatory Visit: Payer: Self-pay | Admitting: Family Medicine

## 2015-08-15 NOTE — Telephone Encounter (Signed)
This is your patient, see med refill request 

## 2015-09-26 ENCOUNTER — Other Ambulatory Visit: Payer: Self-pay | Admitting: Family Medicine

## 2015-10-08 ENCOUNTER — Other Ambulatory Visit: Payer: Self-pay | Admitting: Family Medicine

## 2015-11-26 ENCOUNTER — Encounter: Payer: BLUE CROSS/BLUE SHIELD | Admitting: Internal Medicine

## 2016-01-07 ENCOUNTER — Encounter: Payer: Self-pay | Admitting: Internal Medicine

## 2016-01-07 ENCOUNTER — Ambulatory Visit (INDEPENDENT_AMBULATORY_CARE_PROVIDER_SITE_OTHER): Payer: BLUE CROSS/BLUE SHIELD | Admitting: Internal Medicine

## 2016-01-07 VITALS — BP 135/90 | Temp 97.6°F | Ht 75.0 in | Wt 214.4 lb

## 2016-01-07 DIAGNOSIS — Z1211 Encounter for screening for malignant neoplasm of colon: Secondary | ICD-10-CM | POA: Diagnosis not present

## 2016-01-07 DIAGNOSIS — R972 Elevated prostate specific antigen [PSA]: Secondary | ICD-10-CM | POA: Diagnosis not present

## 2016-01-07 DIAGNOSIS — I10 Essential (primary) hypertension: Secondary | ICD-10-CM | POA: Diagnosis not present

## 2016-01-07 LAB — COMPLETE METABOLIC PANEL WITH GFR
ALBUMIN: 4.6 g/dL (ref 3.6–5.1)
ALK PHOS: 88 U/L (ref 40–115)
ALT: 25 U/L (ref 9–46)
AST: 18 U/L (ref 10–35)
BILIRUBIN TOTAL: 0.4 mg/dL (ref 0.2–1.2)
BUN: 13 mg/dL (ref 7–25)
CO2: 26 mmol/L (ref 20–31)
Calcium: 9.5 mg/dL (ref 8.6–10.3)
Chloride: 104 mmol/L (ref 98–110)
Creat: 1.11 mg/dL (ref 0.70–1.33)
GFR, EST NON AFRICAN AMERICAN: 76 mL/min (ref >=60)
GFR, Est African American: 88 mL/min (ref >=60)
GLUCOSE: 88 mg/dL (ref 65–99)
Potassium: 4.1 mmol/L (ref 3.5–5.3)
SODIUM: 138 mmol/L (ref 135–146)
TOTAL PROTEIN: 7.7 g/dL (ref 6.1–8.1)

## 2016-01-07 LAB — LIPID PANEL
CHOLESTEROL: 174 mg/dL (ref 125–200)
HDL: 48 mg/dL (ref >=40)
LDL Cholesterol: 108 mg/dL (ref <130)
Total CHOL/HDL Ratio: 3.6 Ratio (ref <=5.0)
Triglycerides: 91 mg/dL (ref <150)
VLDL: 18 mg/dL (ref <30)

## 2016-01-07 MED ORDER — AMLODIPINE BESYLATE 5 MG PO TABS: 5.0000 mg | ORAL_TABLET | Freq: Every day | ORAL | Status: DC

## 2016-01-07 MED ORDER — HYDROCHLOROTHIAZIDE 25 MG PO TABS: 25.0000 mg | ORAL_TABLET | Freq: Every day | ORAL | Status: DC

## 2016-01-07 NOTE — Patient Instructions (Signed)
Re-start your Amlodipine and Hydrochlorithiazide as soon as possible. Please return in 2-3 weeks for an appointment to re-check your blood pressure. We may need to increase your dose of medication at that appointment.

## 2016-01-07 NOTE — Addendum Note (Signed)
Addended by: Gilberto BetterSIMPSON, Kerrie Timm R on: 01/07/2016 11:41 AM   Modules accepted: SmartSet

## 2016-01-07 NOTE — Assessment & Plan Note (Addendum)
Uncontrolled at initial check but then at goal on repeat. Suspect elevation related to lack of medication compliance.  -refills given for Amlodipine and HCTZ  -patient to return in 2 weeks for BP check, will adjust medication dose prn  -CMET and lipid panel ordered today

## 2016-01-07 NOTE — Progress Notes (Signed)
   Subjective:    Joseph Singleton - 50 y.o. male MRN [id number]  Date of birth: 12/09/1965  HPI  Joseph CiscoKevin Singleton is here for annual exam.  Chronic HTN Disease Monitoring:  Home BP Monitoring - No  Chest pain- no  Dyspnea- no Headache - no  Medications: Amlodipine 5 mg daily and HCTZ 25 mg daily  Compliance- no, has been without medications for about 1 month   Lightheadedness- no  Edema- no   Elevated Free PSA: Noted at visit in 2/16. Was referred to urology but never went. Denies dysuria, urinary hesitancy, urinary frequency, rectal pain, and low back pain. No family history of prostate cancer.    Health Maintenance:  Health Maintenance Due  Topic Date Due  . Hepatitis C Screening  1966/03/12  . HIV Screening  11/11/1978  . COLONOSCOPY  11/10/2013    -  reports that he quit smoking about 8 years ago. His smoking use included Cigarettes. He has a 10 pack-year smoking history. He does not have any smokeless tobacco history on file. - Review of Systems: Per HPI. - Past Medical History: Patient Active Problem List   Diagnosis Date Noted  . Abnormal PSA 08/20/2014  . Left knee injury 07/22/2014  . Impotence due to erectile dysfunction 07/31/2013  . Preventive measure 07/02/2013  . Essential hypertension, benign 02/14/2008   - Medications: reviewed and updated    Objective:   Physical Exam BP 174/96 mmHg  Temp(Src) 97.6 F (36.4 C) (Oral)  Ht 6\' 3"  (1.905 m)  Wt 214 lb 6.4 oz (97.251 kg)  BMI 26.80 kg/m2  SpO2 100% Gen: NAD, alert, cooperative with exam, well-appearing HEENT: NCAT, PERRL, clear conjunctiva, oropharynx clear, supple neck CV: RRR, good S1/S2, no murmur, no edema, capillary refill brisk  Resp: CTABL, no wheezes, non-labored Abd: SNTND, BS present, no guarding or organomegaly Skin: no rashes, normal turgor  Neuro: no gross deficits.  Psych: good insight, alert and oriented      Assessment & Plan:   Abnormal PSA Patient with elevated PSA  (4) and then low free PSA on reflex lab. However, PSA of 4 collected on day of DRE and on reflex check was normal at 2.7. Suspect elevation in PSA related to DRE. Patient has no other history of elevated PSA, no family history of prostate cancer, and no urinary symptoms.  -will repeat PSA today -if PSA WNL, will not refer to urology and would only repeat if patient developed symptoms concerning for prostate abnormality   Essential hypertension, benign Uncontrolled at initial check but then at goal on repeat. Suspect elevation related to lack of medication compliance.  -refills given for Amlodipine and HCTZ  -patient to return in 2 weeks for BP check, will adjust medication dose prn     Discussed that patient needs colonoscopy. Referral to GI placed.    Marcy Sirenatherine Kaoir Loree, D.O. 01/07/2016, 10:46 AM PGY-2, [facility]

## 2016-01-07 NOTE — Assessment & Plan Note (Signed)
Patient with elevated PSA (4) and then low free PSA on reflex lab. However, PSA of 4 collected on day of DRE and on reflex check was normal at 2.7. Suspect elevation in PSA related to DRE. Patient has no other history of elevated PSA, no family history of prostate cancer, and no urinary symptoms.  -will repeat PSA today -if PSA WNL, will not refer to urology and would only repeat if patient developed symptoms concerning for prostate abnormality

## 2016-01-08 LAB — PSA: PSA: 2.29 ng/mL (ref <=4.00)

## 2016-01-26 ENCOUNTER — Ambulatory Visit: Payer: BLUE CROSS/BLUE SHIELD | Admitting: Internal Medicine

## 2016-01-26 NOTE — Progress Notes (Deleted)
   Subjective:    Joseph Singleton - 50 y.o. male MRN [id number]  Date of birth: Sep 26, 1965  HPI  Joseph Singleton is here for follow up of chronic medical conditions.  Chronic HTN Patient seen at last OV with elevated BPs likely 2/2 to medication non-compliance. Patient has since resumed his home medications.  Disease Monitoring:  Home BP Monitoring - *** Chest pain- {YES/NO/WILD XLKGM:01027}  Dyspnea- {YES/NO/WILD OZDGU:44034} Headache - {YES/NO/WILD VQQVZ:56387}  Medications: Amlodipine 5 mg daily and HCTZ 25 mg daily  Compliance- {YES/NO/WILD FIEPP:29518} Lightheadedness- {YES/NO/WILD ACZYS:06301}  Edema- {YES/NO/WILD SWFUX:32355}   Preventitive Healthcare:  Exercise: {YES/NO/WILD DDUKG:25427}   Diet Pattern: ***  Salt Restriction: ***     Health Maintenance:  - *** Health Maintenance Due  Topic Date Due  . Hepatitis C Screening  [birth year]04[birth year]  . HIV Screening  11/11/1978  . COLONOSCOPY  11/10/2013    -  reports that he quit smoking about 8 years ago. His smoking use included Cigarettes. He has a 10.00 pack-year smoking history. He does not have any smokeless tobacco history on file. - Review of Systems: Per HPI. - Past Medical History: Patient Active Problem List   Diagnosis Date Noted  . Abnormal PSA 08/20/2014  . Left knee injury 07/22/2014  . Impotence due to erectile dysfunction 07/31/2013  . Preventive measure 07/02/2013  . Essential hypertension, benign 02/14/2008   - Medications: reviewed and updated    Objective:   Physical Exam There were no vitals taken for this visit. Gen: NAD, alert, cooperative with exam, well-appearing HEENT: NCAT, PERRL, clear conjunctiva, oropharynx clear, supple neck CV: RRR, good S1/S2, no murmur, no edema, capillary refill brisk  Resp: CTABL, no wheezes, non-labored Abd: SNTND, BS present, no guarding or organomegaly Skin: no rashes, normal turgor  Neuro: no gross deficits.  Psych: good insight, alert and  oriented        Assessment & Plan:   No problem-specific Assessment & Plan notes found for this encounter.    Marcy Siren, D.O. 01/26/2016, 9:26 AM PGY-2, [facility]

## 2016-02-09 ENCOUNTER — Encounter: Payer: Self-pay | Admitting: Internal Medicine

## 2016-04-23 ENCOUNTER — Other Ambulatory Visit: Payer: Self-pay | Admitting: Internal Medicine

## 2016-04-23 DIAGNOSIS — I10 Essential (primary) hypertension: Secondary | ICD-10-CM

## 2016-07-14 ENCOUNTER — Ambulatory Visit (INDEPENDENT_AMBULATORY_CARE_PROVIDER_SITE_OTHER): Payer: BLUE CROSS/BLUE SHIELD | Admitting: Internal Medicine

## 2016-07-14 ENCOUNTER — Encounter: Payer: Self-pay | Admitting: Internal Medicine

## 2016-07-14 VITALS — BP 152/80 | HR 56 | Temp 98.0°F | Ht 75.0 in | Wt 217.8 lb

## 2016-07-14 DIAGNOSIS — E785 Hyperlipidemia, unspecified: Secondary | ICD-10-CM

## 2016-07-14 DIAGNOSIS — R319 Hematuria, unspecified: Secondary | ICD-10-CM

## 2016-07-14 DIAGNOSIS — R351 Nocturia: Secondary | ICD-10-CM | POA: Diagnosis not present

## 2016-07-14 DIAGNOSIS — I10 Essential (primary) hypertension: Secondary | ICD-10-CM

## 2016-07-14 DIAGNOSIS — Z20828 Contact with and (suspected) exposure to other viral communicable diseases: Secondary | ICD-10-CM | POA: Diagnosis not present

## 2016-07-14 DIAGNOSIS — Z202 Contact with and (suspected) exposure to infections with a predominantly sexual mode of transmission: Secondary | ICD-10-CM | POA: Diagnosis not present

## 2016-07-14 LAB — POCT URINALYSIS DIPSTICK
Bilirubin, UA: NEGATIVE
GLUCOSE UA: NEGATIVE
Ketones, UA: NEGATIVE
LEUKOCYTES UA: NEGATIVE
Nitrite, UA: NEGATIVE
Protein, UA: NEGATIVE
SPEC GRAV UA: 1.02
UROBILINOGEN UA: 0.2
pH, UA: 6

## 2016-07-14 LAB — BASIC METABOLIC PANEL WITH GFR
BUN: 16 mg/dL (ref 7–25)
CO2: 29 mmol/L (ref 20–31)
CREATININE: 1.16 mg/dL (ref 0.70–1.33)
Calcium: 10 mg/dL (ref 8.6–10.3)
Chloride: 104 mmol/L (ref 98–110)
GFR, Est African American: 83 mL/min (ref >=60)
GFR, Est Non African American: 72 mL/min (ref >=60)
Glucose, Bld: 91 mg/dL (ref 65–99)
POTASSIUM: 4.3 mmol/L (ref 3.5–5.3)
SODIUM: 139 mmol/L (ref 135–146)

## 2016-07-14 LAB — POCT UA - MICROSCOPIC ONLY

## 2016-07-14 LAB — HIV ANTIBODY (ROUTINE TESTING W REFLEX): HIV 1&2 Ab, 4th Generation: NONREACTIVE

## 2016-07-14 MED ORDER — HYDROCHLOROTHIAZIDE 25 MG PO TABS: 25.0000 mg | ORAL_TABLET | Freq: Every day | ORAL | 3 refills | Status: DC

## 2016-07-14 MED ORDER — ATORVASTATIN CALCIUM 40 MG PO TABS: 40.0000 mg | ORAL_TABLET | Freq: Every day | ORAL | 3 refills | Status: DC

## 2016-07-14 MED ORDER — AMLODIPINE BESYLATE 5 MG PO TABS: 5.0000 mg | ORAL_TABLET | Freq: Every day | ORAL | 3 refills | Status: DC

## 2016-07-14 MED ORDER — TAMSULOSIN HCL 0.4 MG PO CAPS: 0.4000 mg | ORAL_CAPSULE | Freq: Every day | ORAL | 3 refills | Status: DC

## 2016-07-14 NOTE — Patient Instructions (Addendum)
Please return in 4-12 weeks for a lab appointment to have your cholesterol level rechecked. Start taking the Atorvastatin to lower your risk of heart attacks and strokes.   I have refilled both of your blood pressure medications and hopefully the refills will line up better.   Please call the GI doctor to have your colonoscopy done.   Take the Flomax for your frequent urination. Please follow up in one-two months.

## 2016-07-14 NOTE — Progress Notes (Signed)
   Subjective:    Joseph Singleton - 51 y.o. male MRN [id number]  Date of birth: 05/15/1966  HPI  Joseph Singleton is here for follow up.  HTN: Has been compliant with amlodipine. Has not been taking HCTZ for several months. Monitors BP at home and usual range is 140-150s systolic and 80s diastolic. Denies chest pain, headaches, visual changes, and edema.   Nocturia: Reports increase in frequency of urination particularly at night. Gets up to use the bathroom about 2-3 times per night on average. Denies dysuria, suprapubic pain and blood in urine. Denies rectal pain or pain with sitting. Denies fever.    Health Maintenance Due  Topic Date Due  . COLONOSCOPY  11/10/2013  . INFLUENZA VACCINE  02/03/2016    -  reports that he quit smoking about 9 years ago. His smoking use included Cigarettes. He has a 10.00 pack-year smoking history. He does not have any smokeless tobacco history on file. - Review of Systems: Per HPI. - Past Medical History: Patient Active Problem List   Diagnosis Date Noted  . Hyperlipidemia 07/15/2016  . Nocturia 07/15/2016  . Abnormal PSA 08/20/2014  . Left knee injury 07/22/2014  . Impotence due to erectile dysfunction 07/31/2013  . Preventive measure 07/02/2013  . Essential hypertension, benign 02/14/2008   - Medications: reviewed and updated   Objective:   Physical Exam BP (!) 152/80   Pulse (!) 56   Temp 98 F (36.7 C) (Oral)   Ht 6\' 3"  (1.905 m)   Wt 217 lb 12.8 oz (98.8 kg)   BMI 27.22 kg/m  Gen: NAD, alert, cooperative with exam, well-appearing HEENT: NCAT, PERRL, clear conjunctiva, oropharynx clear, supple neck CV: RRR, good S1/S2, no murmur, no edema, capillary refill brisk  Resp: CTABL, no wheezes, non-labored Abd: SNTND, BS present, no guarding or organomegaly Skin: no rashes, normal turgor  Neuro: no gross deficits.  Psych: good insight, alert and oriented    Assessment & Plan:   Essential hypertension, benign Not at goal of <140/90 today but  suspect due to medication non-adherence.  -refills given for Amlodipine and HCTZ  -BMET ordered today  -patient to monitor BPs at home and return for BP recheck in 2 weeks   Hyperlipidemia ASCVD 10 year risk calculated to be 12.5% from most recent lipid panel results.  -start Atorvastatin today  -patient to return within 12 weeks for repeat LDL measurement   Nocturia Symptoms sound consistent with BPH. Patient scored a 6 on the I-PSS scale but stated that he was mostly dissatisfied with quality of life due to urinary symptoms. Patient has previous history of elevated PSA that was normal on repeat check in 2017. UA was obtained and was negative for infection. However, did show small amounts of blood on dipstick and 0-3 RBCs. Given these results, elected to send patient to urology for further evaluation. Flomax started in the interim for symptom control. This plan was discussed and created with Dr. Jennette KettleNeal.   Health Maintenance:  -Hep C and HIV checked today  -colonoscopy handout given    Marcy Sirenatherine Wallace, D.O. 07/15/2016, 11:27 AM PGY-2, [facility]

## 2016-07-15 DIAGNOSIS — N401 Enlarged prostate with lower urinary tract symptoms: Secondary | ICD-10-CM | POA: Insufficient documentation

## 2016-07-15 DIAGNOSIS — R351 Nocturia: Secondary | ICD-10-CM | POA: Insufficient documentation

## 2016-07-15 DIAGNOSIS — E785 Hyperlipidemia, unspecified: Secondary | ICD-10-CM | POA: Insufficient documentation

## 2016-07-15 LAB — HEPATITIS C ANTIBODY: HCV Ab: NEGATIVE

## 2016-07-15 NOTE — Assessment & Plan Note (Signed)
Not at goal of <140/90 today but suspect due to medication non-adherence.  -refills given for Amlodipine and HCTZ  -BMET ordered today  -patient to monitor BPs at home and return for BP recheck in 2 weeks

## 2016-07-15 NOTE — Assessment & Plan Note (Signed)
ASCVD 10 year risk calculated to be 12.5% from most recent lipid panel results.  -start Atorvastatin today  -patient to return within 12 weeks for repeat LDL measurement

## 2016-07-15 NOTE — Assessment & Plan Note (Signed)
Symptoms sound consistent with BPH. Patient scored a 6 on the I-PSS scale but stated that he was mostly dissatisfied with quality of life due to urinary symptoms. Patient has previous history of elevated PSA that was normal on repeat check in 2017. UA was obtained and was negative for infection. However, did show small amounts of blood on dipstick and 0-3 RBCs. Given these results, elected to send patient to urology for further evaluation. Flomax started in the interim for symptom control. This plan was discussed and created with Dr. Jennette KettleNeal.

## 2016-07-16 ENCOUNTER — Telehealth: Payer: Self-pay | Admitting: Internal Medicine

## 2016-07-16 MED ORDER — ATORVASTATIN CALCIUM 40 MG PO TABS: 40.0000 mg | ORAL_TABLET | Freq: Every day | ORAL | 3 refills | Status: DC

## 2016-07-16 MED ORDER — TAMSULOSIN HCL 0.4 MG PO CAPS: 0.4000 mg | ORAL_CAPSULE | Freq: Every day | ORAL | 3 refills | Status: DC

## 2016-07-16 NOTE — Telephone Encounter (Signed)
Sent Flomax and Atorvastatin to [facility] on [address] at wife's request while she was here for her son's doctors appointment.   Marcy Sirenatherine Elbia Paro, D.O. 07/16/2016, 9:09 AM PGY-2, [facility]

## 2017-06-23 ENCOUNTER — Other Ambulatory Visit: Payer: Self-pay

## 2017-06-23 ENCOUNTER — Encounter: Payer: Self-pay | Admitting: Student in an Organized Health Care Education/Training Program

## 2017-06-23 ENCOUNTER — Ambulatory Visit (INDEPENDENT_AMBULATORY_CARE_PROVIDER_SITE_OTHER): Payer: BLUE CROSS/BLUE SHIELD | Admitting: Student in an Organized Health Care Education/Training Program

## 2017-06-23 DIAGNOSIS — I10 Essential (primary) hypertension: Secondary | ICD-10-CM

## 2017-06-23 DIAGNOSIS — Z9114 Patient's other noncompliance with medication regimen: Secondary | ICD-10-CM | POA: Diagnosis not present

## 2017-06-23 MED ORDER — AMLODIPINE BESYLATE 5 MG PO TABS: 5.0000 mg | ORAL_TABLET | Freq: Every day | ORAL | 3 refills | Status: DC

## 2017-06-23 NOTE — Patient Instructions (Signed)
It was a pleasure seeing you today in our clinic. Today we discussed your blood pressure. Here is the treatment plan we have discussed and agreed upon together:  Please come back to have your blood pressure rechecked in 2 weeks.  Our clinic's number is [phone]. Please call with questions or concerns about what we discussed today.  Be well, Dr. Mosetta PuttFeng

## 2017-06-26 ENCOUNTER — Encounter: Payer: Self-pay | Admitting: Student in an Organized Health Care Education/Training Program

## 2017-06-26 DIAGNOSIS — Z9114 Patient's other noncompliance with medication regimen: Secondary | ICD-10-CM | POA: Insufficient documentation

## 2017-06-26 DIAGNOSIS — Z91148 Patient's other noncompliance with medication regimen for other reason: Secondary | ICD-10-CM | POA: Insufficient documentation

## 2017-06-26 NOTE — Assessment & Plan Note (Signed)
HCTZ and amlodipine, however not currently taking norvasc 5 mg.  Hypertensive today, asymptomatic. - restart amlodipine - follow up in 1 week for BP check - patient may need increase of amlodipine dose

## 2017-06-26 NOTE — Assessment & Plan Note (Signed)
[employer] did issue a recall on amlodipine/valsartan combination tablets and amlodipine/valsartan/hydrochlorothiazide, however I do not see any indication that amlodipine itself was recalled. Additionally the pharmacy did not reach out to the patient.  I refilled the amlodipine and asked the patient to restart this medication. He agrees to follow up in next week for a BP check.

## 2017-06-26 NOTE — Progress Notes (Signed)
   CC: Thinks theres a med recall  HPI: Joseph Singleton is a 51 y.o. male with PMH significant for HTN who [facility] with questions about his antihypertensive, amlodipine.  Patient reports that he discontinued use of amlodipine when he saw a commercial on TV suggesting there was a recall on this medication. He did not receive a letter or notification from the pharmacy.    He denies chest pain, headache, vision change, paresthesias, or palpitations.  Review of Symptoms:  See HPI for ROS.   CC, SH/smoking status, and VS noted.  Objective: BP (!) 164/110   Pulse 75   Temp 97.9 F (36.6 C) (Oral)   Ht 6\' 3"  (1.905 m)   Wt 216 lb (98 kg)   SpO2 96%   BMI 27.00 kg/m  GEN: NAD, alert, cooperative, and pleasant RESPIRATORY: clear to auscultation bilaterally with no wheezes, rhonchi or rales, good effort CV: RRR, no m/r/g, no peripheral edema NEURO: II-XII grossly intact, normal gait, peripheral sensation intact PSYCH: AAOx3, appropriate affect  Assessment and plan:  Nonadherence to medication [employer] did issue a recall on amlodipine/valsartan combination tablets and amlodipine/valsartan/hydrochlorothiazide, however I do not see any indication that amlodipine itself was recalled. Additionally the pharmacy did not reach out to the patient.  I refilled the amlodipine and asked the patient to restart this medication. He agrees to follow up in next week for a BP check.  Essential hypertension, benign HCTZ and amlodipine, however not currently taking norvasc 5 mg.  Hypertensive today, asymptomatic. - restart amlodipine - follow up in 1 week for BP check - patient may need increase of amlodipine dose   No orders of the defined types were placed in this encounter.   Meds ordered this encounter  Medications  . amLODipine (NORVASC) 5 MG tablet    Sig: Take 1 tablet (5 mg total) by mouth daily.    Dispense:  90 tablet    Refill:  3     Howard PouchLauren Tracie Dore, MD,MS,   PGY2 06/26/2017 7:41 AM

## 2017-07-20 ENCOUNTER — Ambulatory Visit (INDEPENDENT_AMBULATORY_CARE_PROVIDER_SITE_OTHER): Payer: BLUE CROSS/BLUE SHIELD | Admitting: Internal Medicine

## 2017-07-20 ENCOUNTER — Encounter: Payer: Self-pay | Admitting: Internal Medicine

## 2017-07-20 ENCOUNTER — Other Ambulatory Visit: Payer: Self-pay

## 2017-07-20 VITALS — BP 142/100 | HR 90 | Temp 97.8°F | Ht 75.0 in | Wt 210.2 lb

## 2017-07-20 DIAGNOSIS — I10 Essential (primary) hypertension: Secondary | ICD-10-CM | POA: Diagnosis not present

## 2017-07-20 DIAGNOSIS — Z0000 Encounter for general adult medical examination without abnormal findings: Secondary | ICD-10-CM

## 2017-07-20 MED ORDER — AMLODIPINE BESYLATE 10 MG PO TABS: 10.0000 mg | ORAL_TABLET | Freq: Every day | ORAL | 3 refills | Status: DC

## 2017-07-20 NOTE — Patient Instructions (Signed)
I have increased your Amlodipine to 10 mg. I have sent this to your pharmacy. You can take two of the 5 mg tablets you have at home each day until you run out of that bottle.   Please go to get your colonoscopy! It is very important for colon cancer screening.   Good to see you today!

## 2017-07-20 NOTE — Progress Notes (Signed)
   Subjective:    Joseph Singleton - 52 y.o. male MRN [id number]  Date of birth: 06/28/1966  HPI  Joseph Singleton is here for annual exam.  Chronic HTN Disease Monitoring:  Home BP Monitoring - No  Chest pain- no  Dyspnea- no Headache - no  Medications: HCTZ 25 mg and Amlodipine 5 mg Compliance- yes, but had been without Amlodipine due to "recall" seen on TV until about 3-4 weeks ago  Lightheadedness- no  Edema- no   Preventitive Healthcare:  Exercise: no   Diet Pattern: eats 3 meals a day. Occasional fast food use.   Salt Restriction: No     ROS:  Patient reports no  vision/ hearing changes,anorexia, weight change, fever ,adenopathy, persistant / recurrent hoarseness, swallowing issues, chest pain, edema,persistant / recurrent cough, hemoptysis, dyspnea(rest, exertional, paroxysmal nocturnal), gastrointestinal  bleeding (melena, rectal bleeding), abdominal pain, excessive heart burn, GU symptoms(dysuria, hematuria, pyuria, voiding/incontinence  Issues) syncope, focal weakness, severe memory loss, concerning skin lesions, depression, anxiety, abnormal bruising/bleeding, major joint swelling.     Health Maintenance Due  Topic Date Due  . COLONOSCOPY  11/10/2013    -  reports that he quit smoking about 10 years ago. His smoking use included cigarettes. He has a 10.00 pack-year smoking history. he has never used smokeless tobacco. - Review of Systems: Per HPI. - Past Medical History: Patient Active Problem List   Diagnosis Date Noted  . Nonadherence to medication 06/26/2017  . Hyperlipidemia 07/15/2016  . Nocturia 07/15/2016  . Abnormal PSA 08/20/2014  . Left knee injury 07/22/2014  . Impotence due to erectile dysfunction 07/31/2013  . Preventive measure 07/02/2013  . Essential hypertension, benign 02/14/2008   - Medications: reviewed and updated   Objective:   Physical Exam BP (!) 142/100   Pulse 90   Temp 97.8 F (36.6 C) (Oral)   Ht 6\' 3"  (1.905 m)   Wt  210 lb 3.2 oz (95.3 kg)   SpO2 98%   BMI 26.27 kg/m  Gen: NAD, alert, cooperative with exam, well-appearing HEENT: NCAT, PERRL, clear conjunctiva, oropharynx clear, supple neck CV: RRR, good S1/S2, no murmur, no edema, capillary refill brisk  Resp: CTABL, no wheezes, non-labored Abd: SNTND, BS present, no guarding or organomegaly Skin: no rashes, normal turgor  Neuro: no gross deficits.  Psych: good insight, alert and oriented    Assessment & Plan:   Essential hypertension, benign BP still not at goal and has been elevated on many office visits in past (often due to non-adherence). Will increase Amlodipine to 10 mg daily as patient reports he has been compliant now. Return in 3 months for follow up and will check BMET then.   Healthcare Maintenance Discussed colonoscopy and handout given. Patient reports he will call today to schedule an appointment with GI.    Marcy Sirenatherine Tineshia Becraft, D.O. 07/20/2017, 3:46 PM PGY-3, [facility]

## 2017-07-20 NOTE — Assessment & Plan Note (Signed)
BP still not at goal and has been elevated on many office visits in past (often due to non-adherence). Will increase Amlodipine to 10 mg daily as patient reports he has been compliant now. Return in 3 months for follow up and will check BMET then.

## 2017-07-21 ENCOUNTER — Telehealth: Payer: Self-pay | Admitting: Internal Medicine

## 2017-07-21 DIAGNOSIS — Z1211 Encounter for screening for malignant neoplasm of colon: Secondary | ICD-10-CM

## 2017-07-21 NOTE — Telephone Encounter (Signed)
Referral to GI for colonoscopy placed.   Marcy Sirenatherine Wallace, D.O. 07/21/2017, 6:05 PM PGY-3, [facility]

## 2017-07-21 NOTE — Telephone Encounter (Signed)
Pt wants a referral to [name] to get his colonoscopy .

## 2017-09-07 ENCOUNTER — Telehealth: Payer: Self-pay | Admitting: Internal Medicine

## 2017-09-07 NOTE — Telephone Encounter (Signed)
Foster care form dropped off for at front desk for completion.  Verified that patient section of form has been completed.  Last DOS/WCC with PCP was 07/20/17.  Placed form in  [facility] team folder to be completed by clinical staff.  Lina Sarheryl A Stanley

## 2017-09-07 NOTE — Telephone Encounter (Signed)
Clinical info completed on Medical Evaluation form.  Place form in Dr. Wallace's box for completion.  Zimmerman Rumple, Terrionna Bridwell D, CMA  

## 2017-09-12 ENCOUNTER — Other Ambulatory Visit: Payer: Self-pay | Admitting: *Deleted

## 2017-09-12 DIAGNOSIS — I10 Essential (primary) hypertension: Secondary | ICD-10-CM

## 2017-09-13 MED ORDER — HYDROCHLOROTHIAZIDE 25 MG PO TABS: 25.0000 mg | ORAL_TABLET | Freq: Every day | ORAL | 3 refills | Status: DC

## 2017-09-15 NOTE — Telephone Encounter (Signed)
Reviewed, completed, and signed form.  Note routed to RN team inbasket and placed completed form in Clinic RN's office (wall pocket above desk).  Laniya Friedl L Kayin Kettering, DO  

## 2017-09-15 NOTE — Telephone Encounter (Signed)
Gailen ShelterGloria Figiel notified that both her and Namari's forms are ready for pickup. Ples SpecterAlisa Brake, RN [facility] RN)

## 2017-12-20 DIAGNOSIS — D126 Benign neoplasm of colon, unspecified: Secondary | ICD-10-CM | POA: Diagnosis not present

## 2017-12-20 DIAGNOSIS — Z1211 Encounter for screening for malignant neoplasm of colon: Secondary | ICD-10-CM | POA: Diagnosis not present

## 2017-12-23 DIAGNOSIS — D126 Benign neoplasm of colon, unspecified: Secondary | ICD-10-CM | POA: Diagnosis not present

## 2017-12-23 DIAGNOSIS — Z1211 Encounter for screening for malignant neoplasm of colon: Secondary | ICD-10-CM | POA: Diagnosis not present

## 2018-08-04 ENCOUNTER — Other Ambulatory Visit: Payer: Self-pay

## 2018-08-04 DIAGNOSIS — I10 Essential (primary) hypertension: Secondary | ICD-10-CM

## 2018-08-04 MED ORDER — AMLODIPINE BESYLATE 10 MG PO TABS: 10.0000 mg | ORAL_TABLET | Freq: Every day | ORAL | 0 refills | Status: DC

## 2018-08-04 NOTE — Telephone Encounter (Signed)
Patient needs to be seen in clinic for annual chronic HTN. Please call to schedule. Will send in 1 month.   Genia Hotterachel Cartina Brousseau, M.D. 08/04/2018, 10:58 AM PGY-1, [facility]

## 2018-10-12 ENCOUNTER — Other Ambulatory Visit: Payer: Self-pay | Admitting: *Deleted

## 2018-10-12 DIAGNOSIS — I10 Essential (primary) hypertension: Secondary | ICD-10-CM

## 2018-10-13 MED ORDER — HYDROCHLOROTHIAZIDE 25 MG PO TABS: 25.0000 mg | ORAL_TABLET | Freq: Every day | ORAL | 0 refills | Status: DC

## 2018-10-13 NOTE — Telephone Encounter (Signed)
Patient has not been seen since January 2019. Filled Rx but no refills provided. Please call patient to make an appointment to be seen on the [facility] Schedule.

## 2018-11-13 ENCOUNTER — Other Ambulatory Visit: Payer: Self-pay | Admitting: Family Medicine

## 2018-11-13 DIAGNOSIS — I10 Essential (primary) hypertension: Secondary | ICD-10-CM

## 2018-11-21 ENCOUNTER — Encounter: Payer: Self-pay | Admitting: Family Medicine

## 2018-11-21 ENCOUNTER — Other Ambulatory Visit: Payer: Self-pay

## 2018-11-21 ENCOUNTER — Ambulatory Visit (INDEPENDENT_AMBULATORY_CARE_PROVIDER_SITE_OTHER): Payer: BLUE CROSS/BLUE SHIELD | Admitting: Family Medicine

## 2018-11-21 VITALS — BP 121/80 | HR 73 | Ht 74.0 in | Wt 212.0 lb

## 2018-11-21 DIAGNOSIS — M25561 Pain in right knee: Secondary | ICD-10-CM | POA: Diagnosis not present

## 2018-11-21 DIAGNOSIS — Z0000 Encounter for general adult medical examination without abnormal findings: Secondary | ICD-10-CM

## 2018-11-21 DIAGNOSIS — Z1211 Encounter for screening for malignant neoplasm of colon: Secondary | ICD-10-CM

## 2018-11-21 DIAGNOSIS — I10 Essential (primary) hypertension: Secondary | ICD-10-CM

## 2018-11-21 DIAGNOSIS — G8929 Other chronic pain: Secondary | ICD-10-CM

## 2018-11-21 DIAGNOSIS — E663 Overweight: Secondary | ICD-10-CM

## 2018-11-21 DIAGNOSIS — R972 Elevated prostate specific antigen [PSA]: Secondary | ICD-10-CM

## 2018-11-21 DIAGNOSIS — M25562 Pain in left knee: Secondary | ICD-10-CM

## 2018-11-21 MED ORDER — HYDROCHLOROTHIAZIDE 25 MG PO TABS: 25.0000 mg | ORAL_TABLET | Freq: Every day | ORAL | 3 refills | Status: DC

## 2018-11-21 MED ORDER — AMLODIPINE BESYLATE 10 MG PO TABS: 10.0000 mg | ORAL_TABLET | Freq: Every day | ORAL | 3 refills | Status: DC

## 2018-11-21 NOTE — Progress Notes (Signed)
Established Patient - Clinic Visit Subjective  Subjective  Patient ID: MRN [id number]  Date of birth: 20-Jan-1966  PCP: Melene Plan, MD  CC: Annual Exam and Knee Pain  HPI:  Joseph Singleton is a 53 y.o. male with PMHx significant for HTN, HLD, left knee injury, who presents to clinic for the following: # Hypertension follow up Taking medications: amlodipine and HCTZ. BP monitoring at home- 120-150 systolics 85-90 diastolic. Changed diet a lot and now eats more fruits and vegetables, chicken. Does not like red meat and does not eat as much pork. No salt in diet. Denies changes in vision, palpitations, leg swelling.    # Bilateral Knee Pain On a normal day, patient reports that pain is a 3-4/10. On a bad day, hurts and aches all day and can be exacerbated with sharp pains with movement on the sides above patella. Especially at work, harder getting up the steps. Seen knee doctor in the past and knows cartilage is getting bad on left side. On waking up in the morning, its stiff. 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 Only uses ibuprofen a couple times a week if it is bad. More bad weeks than good. Rest makes the knees feel better. Uses ice and heat once in a while. Experiences cracking in knees. Denies accident or trauma. Was in [employer]. Played a lot of sports. Had injections in 2016 and told not to do as it caused an AKI. Denies getting red or hot, but does swell. Achy with rain or humid days. Doesn't keep patient from doing things on a day to day basis, but it does keep getting worse.   ROS: See HPI  HISTORY Meds  Allergies  PMHx: Reviewed as appropriate   Hypertension Social Hx: Joseph Singleton reports that he quit smoking about 11 years ago. His smoking use included cigarettes. He has a 10.00 pack-year smoking history. He has never used smokeless tobacco. He reports current alcohol use. He reports that he does not use drugs. 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 Son 38, daughter 75. No drinking on daily basis.    Smoked for 10-15 years <1ppd.      Objective  Objective  Physical Exam:  BP 121/80   Pulse 73   Ht 6\' 2"  (1.88 m)   Wt 212 lb (96.2 kg)   SpO2 98%   BMI 27.22 kg/m  General: NAD, non-toxic, well-appearing, sitting comfortably   Cardiovascular: RRR, normal S1, S2. 2+ RP bilaterally. No BLEE Respiratory: CTAB. No IWOB.  Abdomen: + BS. NT, ND, soft to palpation.  Extremities: Warm and well perfused. Moving spontaneously. Knees: No erythema, swelling on initial inspection and palpation. No bulge and balloon. Full ROM with flexion/extension, internal/external rotation bilaterally. Clicking appreciated with extension to flexion bilaterally,  R>L. Mild laxity with valgus stress on left knee. Varus/valgus normal on right. Negative McMurrays bilaterally.  Gait wnl. No difficulty with standing from chair. No limping appreciated.  Neuro: Alert & oriented x 4.  CN grossly intact.    Pertinent Labs & Imaging:  08/15/2014: A1c 5.3 07/04/2013: Lipid profile with HDL low LDL high 01/07/2016: Lipid profile within normal limits 07/14/2016: BMP within normal limits.  Creatinine 1.11.  Hep C screening negative.  HIV nonreactive.  08/14/2014 L Knee:  There is mild osteoarthritic change of the left knee manifested chiefly by beaking of the tibial spines. No acute bony abnormality is demonstrated.  [name]-  2019 - showed polyps that were benign. Repeat colonoscopy in 2021.     Assessment & Plan  Essential hypertension, benign Well controlled on current regimen with good lifestyle management.   No changes   Labs wnl and Cr wnl. Can consider urine microalbumin  in the future for ACE/ARB addition.    Abnormal PSA Did not address today in clinic.  Will address at next follow-up.  Hyperlipidemia Panel within normal limits.  No need for statin therapy at this time.    Chronic pain of both knees Likely due to osteoarthritis bilaterally.  Physical exam is not consistent with any acute injuries and patient denies any recent trauma.  Previous imaging with osteoarthritis on left side.  Likely due to worsening of this.  Patient would like to opt to get bilateral x-rays to rule out other pathologic causes and confirm clinical suspicion of osteoarthritis.  While imaging is not always consistent with the amount of pain that patient appreciates, patient would like to obtain baseline imaging as he is 53 years old and continues to stay active.   Follow-up:  No future appointments.  Joseph Singleton, M.D. [facility]  PGY -1 11/23/2018, 5:56 PM

## 2018-11-21 NOTE — Patient Instructions (Addendum)
Dear Joseph Singleton,   It was very nice to see you! Thank you for taking your time to come in to be seen. Today, we discussed the following:   High Blood Pressure  Your blood pressure is very well controlled.  Please keep up the great work with your diet, taking her medications, and exercising as often as possible.  Longstanding high blood pressure can lead to kidney problems.  We are obtaining labs today to ensure your kidneys are still functioning normally.  Knee pain  Urinary pain is concerning for osteoarthritis which is a normal process that happens over time.  We will get x-rays to confirm osteoarthritis and rule out other causes of knee pain.  I will call you with any results  Exercise is an important aspect of knee OA management, as muscle weakness appears to be a factor in disease development and adequate strength of quadriceps and hamstrings is required for independence during basic activities of daily living (such as walking and standing)   regular participation in an exercise program is recommended  recommended exercise program components include   cardiovascular/low-impact aerobic exercise  strengthening/resistance land-based training   aquatic exercise  adjunctive range of motion and stretching exercises  Be well,   Dr. Genia Hotterachel Marsel [facility] [phone]   Sign up for MyChart for instant access to your health profile, labs, orders, upcoming appointments or to contact your provider with questions.    Knee Exercises Ask your health care provider which exercises are safe for you. Do exercises exactly as told by your health care provider and adjust them as directed. It is normal to feel mild stretching, pulling, tightness, or discomfort as you do these exercises, but you should stop right away if you feel sudden pain or your pain gets worse.Do not begin these exercises until told by your health care provider. STRETCHING AND RANGE OF MOTION  EXERCISES These exercises warm up your muscles and joints and improve the movement and flexibility of your knee. These exercises also help to relieve pain, numbness, and tingling. Exercise A: Knee Extension, Prone 1. Lie on your abdomen on a bed. 2. Place your left / right knee just beyond the edge of the surface so your knee is not on the bed. You can put a towel under your left / right thigh just above your knee for comfort. 3. Relax your leg muscles and allow gravity to straighten your knee. You should feel a stretch behind your left / right knee. 4. Hold this position for __________ seconds. 5. Scoot up so your knee is supported between repetitions. Repeat __________ times. Complete this stretch __________ times a day. Exercise B: Knee Flexion, Active  1. Lie on your back with both knees straight. If this causes back discomfort, bend your left / right knee so your foot is flat on the floor. 2. Slowly slide your left / right heel back toward your buttocks until you feel a gentle stretch in the front of your knee or thigh. 3. Hold this position for __________ seconds. 4. Slowly slide your left / right heel back to the starting position. Repeat __________ times. Complete this exercise __________ times a day. Exercise C: Quadriceps, Prone  1. Lie on your abdomen on a firm surface, such as a bed or padded floor. 2. Bend your left / right knee and hold your ankle. If you cannot reach your ankle or pant leg, loop a belt around your foot and grab the belt instead. 3. Gently pull  your heel toward your buttocks. Your knee should not slide out to the side. You should feel a stretch in the front of your thigh and knee. 4. Hold this position for __________ seconds. Repeat __________ times. Complete this stretch __________ times a day. Exercise D: Hamstring, Supine 1. Lie on your back. 2. Loop a belt or towel over the ball of your left / right foot. The ball of your foot is on the walking surface,  right under your toes. 3. Straighten your left / right knee and slowly pull on the belt to raise your leg until you feel a gentle stretch behind your knee. ? Do not let your left / right knee bend while you do this. ? Keep your other leg flat on the floor. 4. Hold this position for __________ seconds. Repeat __________ times. Complete this stretch __________ times a day. STRENGTHENING EXERCISES These exercises build strength and endurance in your knee. Endurance is the ability to use your muscles for a long time, even after they get tired. Exercise E: Quadriceps, Isometric  1. Lie on your back with your left / right leg extended and your other knee bent. Put a rolled towel or small pillow under your knee if told by your health care provider. 2. Slowly tense the muscles in the front of your left / right thigh. You should see your kneecap slide up toward your hip or see increased dimpling just above the knee. This motion will push the back of the knee toward the floor. 3. For __________ seconds, keep the muscle as tight as you can without increasing your pain. 4. Relax the muscles slowly and completely. Repeat __________ times. Complete this exercise __________ times a day. Exercise F: Straight Leg Raises - Quadriceps 1. Lie on your back with your left / right leg extended and your other knee bent. 2. Tense the muscles in the front of your left / right thigh. You should see your kneecap slide up or see increased dimpling just above the knee. Your thigh may even shake a bit. 3. Keep these muscles tight as you raise your leg 4-6 inches (10-15 cm) off the floor. Do not let your knee bend. 4. Hold this position for __________ seconds. 5. Keep these muscles tense as you lower your leg. 6. Relax your muscles slowly and completely after each repetition. Repeat __________ times. Complete this exercise __________ times a day. Exercise G: Hamstring, Isometric 1. Lie on your back on a firm  surface. 2. Bend your left / right knee approximately __________ degrees. 3. Dig your left / right heel into the surface as if you are trying to pull it toward your buttocks. Tighten the muscles in the back of your thighs to dig as hard as you can without increasing any pain. 4. Hold this position for __________ seconds. 5. Release the tension gradually and allow your muscles to relax completely for __________ seconds after each repetition. Repeat __________ times. Complete this exercise __________ times a day. Exercise H: Hamstring Curls  If told by your health care provider, do this exercise while wearing ankle weights. Begin with __________ weights. Then increase the weight by 1 lb (0.5 kg) increments. Do not wear ankle weights that are more than __________. 1. Lie on your abdomen with your legs straight. 2. Bend your left / right knee as far as you can without feeling pain. Keep your hips flat against the floor. 3. Hold this position for __________ seconds. 4. Slowly lower your leg to the  starting position.  Repeat __________ times. Complete this exercise __________ times a day. Exercise I: Squats (Quadriceps) 1. Stand in front of a table, with your feet and knees pointing straight ahead. You may rest your hands on the table for balance but not for support. 2. Slowly bend your knees and lower your hips like you are going to sit in a chair. ? Keep your weight over your heels, not over your toes. ? Keep your lower legs upright so they are parallel with the table legs. ? Do not let your hips go lower than your knees. ? Do not bend lower than told by your health care provider. ? If your knee pain increases, do not bend as low. 3. Hold the squat position for __________ seconds. 4. Slowly push with your legs to return to standing. Do not use your hands to pull yourself to standing. Repeat __________ times. Complete this exercise __________ times a day. Exercise J: Wall Slides  (Quadriceps)  1. Lean your back against a smooth wall or door while you walk your feet out 18-24 inches (46-61 cm) from it. 2. Place your feet hip-width apart. 3. Slowly slide down the wall or door until your knees bend __________ degrees. Keep your knees over your heels, not over your toes. Keep your knees in line with your hips. 4. Hold for __________ seconds. Repeat __________ times. Complete this exercise __________ times a day. Exercise K: Straight Leg Raises - Hip Abductors 1. Lie on your side with your left / right leg in the top position. Lie so your head, shoulder, knee, and hip line up. You may bend your bottom knee to help you keep your balance. 2. Roll your hips slightly forward so your hips are stacked directly over each other and your left / right knee is facing forward. 3. Leading with your heel, lift your top leg 4-6 inches (10-15 cm). You should feel the muscles in your outer hip lifting. ? Do not let your foot drift forward. ? Do not let your knee roll toward the ceiling. 4. Hold this position for __________ seconds. 5. Slowly return your leg to the starting position. 6. Let your muscles relax completely after each repetition. Repeat __________ times. Complete this exercise __________ times a day. Exercise L: Straight Leg Raises - Hip Extensors 1. Lie on your abdomen on a firm surface. You can put a pillow under your hips if that is more comfortable. 2. Tense the muscles in your buttocks and lift your left / right leg about 4-6 inches (10-15 cm). Keep your knee straight as you lift your leg. 3. Hold this position for __________ seconds. 4. Slowly lower your leg to the starting position. 5. Let your leg relax completely after each repetition. Repeat __________ times. Complete this exercise __________ times a day. This information is not intended to replace advice given to you by your health care provider. Make sure you discuss any questions you have with your health care  provider. Document Released: 05/05/2005 Document Revised: 03/15/2016 Document Reviewed: 04/27/2015 Elsevier Interactive Patient Education  2018 [employer].

## 2018-11-22 LAB — COMPREHENSIVE METABOLIC PANEL
ALT: 42 IU/L (ref 0–44)
AST: 22 IU/L (ref 0–40)
Albumin/Globulin Ratio: 1.5 (ref 1.2–2.2)
Albumin: 4.6 g/dL (ref 3.8–4.9)
Alkaline Phosphatase: 89 IU/L (ref 39–117)
BUN/Creatinine Ratio: 14 (ref 9–20)
BUN: 16 mg/dL (ref 6–24)
Bilirubin Total: 0.4 mg/dL (ref 0.0–1.2)
CO2: 23 mmol/L (ref 20–29)
Calcium: 10.1 mg/dL (ref 8.7–10.2)
Chloride: 100 mmol/L (ref 96–106)
Creatinine, Ser: 1.18 mg/dL (ref 0.76–1.27)
GFR calc Af Amer: 80 mL/min/{1.73_m2} (ref >59)
GFR calc non Af Amer: 69 mL/min/{1.73_m2} (ref >59)
Globulin, Total: 3 g/dL (ref 1.5–4.5)
Glucose: 89 mg/dL (ref 65–99)
Potassium: 4.4 mmol/L (ref 3.5–5.2)
Sodium: 137 mmol/L (ref 134–144)
Total Protein: 7.6 g/dL (ref 6.0–8.5)

## 2018-11-22 LAB — LIPID PANEL
Chol/HDL Ratio: 3.4 ratio (ref 0.0–5.0)
Cholesterol, Total: 182 mg/dL (ref 100–199)
HDL: 54 mg/dL (ref >39)
LDL Calculated: 98 mg/dL (ref 0–99)
Triglycerides: 149 mg/dL (ref 0–149)
VLDL Cholesterol Cal: 30 mg/dL (ref 5–40)

## 2018-11-23 ENCOUNTER — Encounter: Payer: Self-pay | Admitting: Family Medicine

## 2018-11-23 DIAGNOSIS — M25562 Pain in left knee: Secondary | ICD-10-CM | POA: Insufficient documentation

## 2018-11-23 DIAGNOSIS — G8929 Other chronic pain: Secondary | ICD-10-CM | POA: Insufficient documentation

## 2018-11-23 DIAGNOSIS — E663 Overweight: Secondary | ICD-10-CM | POA: Insufficient documentation

## 2018-11-23 NOTE — Assessment & Plan Note (Signed)
Well controlled on current regimen with good lifestyle management.   No changes   Labs wnl and Cr wnl. Can consider urine microalbumin in the future for ACE/ARB addition.

## 2018-11-23 NOTE — Assessment & Plan Note (Signed)
Did not address today in clinic.  Will address at next follow-up.

## 2018-11-23 NOTE — Assessment & Plan Note (Signed)
Panel within normal limits.  No need for statin therapy at this time.

## 2018-11-23 NOTE — Assessment & Plan Note (Addendum)
Likely due to osteoarthritis bilaterally.  Physical exam is not consistent with any acute injuries and patient denies any recent trauma.  Previous imaging with osteoarthritis on left side.  Likely due to worsening of this.  Patient would like to opt to get bilateral x-rays to rule out other pathologic causes and confirm clinical suspicion of osteoarthritis.  While imaging is not always consistent with the amount of pain that patient appreciates, patient would like to obtain baseline imaging as he is 53 years old and continues to stay active.

## 2018-11-28 ENCOUNTER — Ambulatory Visit
Admission: RE | Admit: 2018-11-28 | Discharge: 2018-11-28 | Disposition: A | Discharge status: Home / Self Care | Payer: BLUE CROSS/BLUE SHIELD | Source: Ambulatory Visit | Attending: Family Medicine | Admitting: Family Medicine

## 2018-11-28 ENCOUNTER — Other Ambulatory Visit: Payer: Self-pay

## 2018-11-28 DIAGNOSIS — G8929 Other chronic pain: Secondary | ICD-10-CM

## 2018-11-28 DIAGNOSIS — M25562 Pain in left knee: Secondary | ICD-10-CM | POA: Diagnosis not present

## 2018-11-28 DIAGNOSIS — M25561 Pain in right knee: Secondary | ICD-10-CM

## 2018-12-12 ENCOUNTER — Telehealth: Payer: Self-pay | Admitting: Family Medicine

## 2018-12-12 NOTE — Telephone Encounter (Signed)
Patient came into office to drop off paperwork of x-rays. Patient was last seen in office on 11-21-2018. Paperwork was placed in white team folder upfront. Joseph Singleton

## 2018-12-14 NOTE — Telephone Encounter (Signed)
Release of information form completed by pt for xrays report.   Place form in Dr. Julianne Rice box for completion.  Joseph Singleton, CMA

## 2019-01-12 ENCOUNTER — Telehealth: Payer: Self-pay | Admitting: Family Medicine

## 2019-01-12 NOTE — Telephone Encounter (Signed)
Pt's wife is calling and would like to know if Dr. Maudie Mercury can place a referral for the pt to see a neurologist.

## 2019-01-15 NOTE — Telephone Encounter (Signed)
Attempted to call patient twice with no answer and unable to leave voicemail. If patient calls back, please ask what they would like to neuro referral for as I do not see any previous visits for neurological problems.  Apolonio Schneiders

## 2019-01-16 NOTE — Telephone Encounter (Signed)
Pt wife is calling back to check on referral. I informed her of below. She said that she was confused on what he needs a referral for. She didn't mean to ask for a neurologist referral but needs to discuss issues with his prostate.   Pt wife would like for Dr. Maudie Mercury to call her at [phone] to discuss.

## 2019-01-17 NOTE — Telephone Encounter (Signed)
Pt wife calling again. Upset because she has not heard from Dr. Maudie Mercury yet. Please call pt on [phone]. Ottis Stain, CMA

## 2019-01-18 ENCOUNTER — Other Ambulatory Visit: Payer: Self-pay | Admitting: Family Medicine

## 2019-01-18 DIAGNOSIS — N429 Disorder of prostate, unspecified: Secondary | ICD-10-CM

## 2019-01-18 NOTE — Telephone Encounter (Signed)
Patient calling because he thinks he has something wrong with his prostate. Patient reports that it is "aggravating" him per his wife with  Whom I spoke on the phone.  Patient has previously been referred to urology (2019) but has since changed insurance and requires another referral. Will send referral. If pt is to get fevers, chills, severe pain, or inability to urinate, he should go straight to the ED .  Wilber Oliphant, M.D.  PGY-2  Family Medicine  01/18/2019 11:30 AM

## 2019-02-02 ENCOUNTER — Encounter (HOSPITAL_COMMUNITY): Payer: Self-pay

## 2019-02-02 ENCOUNTER — Ambulatory Visit (HOSPITAL_COMMUNITY)
Admission: EM | Admit: 2019-02-02 | Discharge: 2019-02-02 | Disposition: A | Discharge status: Home / Self Care | Payer: BC Managed Care – PPO | Attending: Family Medicine | Admitting: Family Medicine

## 2019-02-02 ENCOUNTER — Other Ambulatory Visit: Payer: Self-pay

## 2019-02-02 DIAGNOSIS — M25561 Pain in right knee: Secondary | ICD-10-CM

## 2019-02-02 DIAGNOSIS — M25461 Effusion, right knee: Secondary | ICD-10-CM

## 2019-02-02 MED ORDER — BENZOCAINE 20 % MT AERO
INHALATION_SPRAY | OROMUCOSAL | Status: AC
  Filled 2019-02-02: qty 57

## 2019-02-02 MED ORDER — TRIAMCINOLONE ACETONIDE 40 MG/ML IJ SUSP
INTRAMUSCULAR | Status: AC
  Filled 2019-02-02: qty 1

## 2019-02-02 MED ORDER — PREDNISONE 10 MG (21) PO TBPK: ORAL_TABLET | ORAL | 0 refills | Status: DC

## 2019-02-02 NOTE — ED Triage Notes (Signed)
Pt presents with right side knee swelling not injury related.

## 2019-02-02 NOTE — ED Provider Notes (Signed)
[facility]    CSN: [id number] Arrival date & time: 02/02/19  0807     History   Chief Complaint Chief Complaint  Patient presents with  . Knee Swelling    HPI Joseph Singleton is a 53 y.o. male.   Patient is a 53 year old male with a past medical history of hypertension, chronic knee pain.  He presents today with right knee pain and swelling over the past 2 days.  History of same.  Symptoms have remained constant.  He has not taken anything for his symptoms.  Recent x-rays revealed degenerative changes in both knees.  Denies any injury to the knee.  Denies any fever.  Pain worse with ambulation.  ROS per HPI      Past Medical History:  Diagnosis Date  . Hypertension   . Perirectal abscess 06/07/2011   Required I&D, most recently 05/2011 in ED.    Marland Kitchen. Rhinitis medicamentosa 06/23/2012    Patient Active Problem List   Diagnosis Date Noted  . Chronic pain of both knees 11/23/2018  . Overweight (BMI 25.0-29.9) 11/23/2018  . Nonadherence to medication 06/26/2017  . Nocturia 07/15/2016  . Abnormal PSA 08/20/2014  . Left knee injury 07/22/2014  . Impotence due to erectile dysfunction 07/31/2013  . Screening for colon cancer 07/02/2013  . Essential hypertension, benign 02/14/2008    Past Surgical History:  Procedure Laterality Date  . TESTICLE SURGERY     unsure of reason  . THUMB AMPUTATION     partial left-post accident       Home Medications    Prior to Admission medications   Medication Sig Start Date End Date Taking? Authorizing Provider  amLODipine (NORVASC) 10 MG tablet Take 1 tablet (10 mg total) by mouth daily. 11/21/18   Melene PlanKim, Rachel E, MD  hydrochlorothiazide (HYDRODIURIL) 25 MG tablet Take 1 tablet (25 mg total) by mouth daily. 11/21/18   Melene PlanKim, Rachel E, MD  predniSONE (STERAPRED UNI-PAK 21 TAB) 10 MG (21) TBPK tablet 6 tabs for 1 day, then 5 tabs for 1 das, then 4 tabs for 1 day, then 3 tabs for 1 day, 2 tabs for 1 day, then 1 tab for 1 day  02/02/19   Janace ArisBast, Brixon Zhen A, NP    Family History Family History  Problem Relation Age of Onset  . Heart disease Mother   . Hypertension Father   . Diabetes Father   . Cancer Maternal Aunt        breast  . Hypertension Brother   . Diabetes Brother   . Asthma Sister   . Lupus Sister     Social History Social History   Tobacco Use  . Smoking status: Former Smoker    Packs/day: 1.00    Years: 10.00    Pack years: 10.00    Types: Cigarettes    Quit date: 06/05/2007    Years since quitting: 11.6  . Smokeless tobacco: Never Used  Substance Use Topics  . Alcohol use: Yes    Comment: <1/week  . Drug use: No     Allergies   Patient has no known allergies.   Review of Systems Review of Systems   Physical Exam Triage Vital Signs ED Triage Vitals  Enc Vitals Group     BP 02/02/19 0829 (!) 145/91     Pulse Rate 02/02/19 0829 70     Resp 02/02/19 0829 18     Temp 02/02/19 0829 97.9 F (36.6 C)     Temp Source 02/02/19 0829  Oral     SpO2 02/02/19 0829 97 %     Weight --      Height --      Head Circumference --      Peak Flow --      Pain Score 02/02/19 0831 7     Pain Loc --      Pain Edu? --      Excl. in [place]? --    No data found.  Updated Vital Signs BP (!) 145/91 (BP Location: Left Arm)   Pulse 70   Temp 97.9 F (36.6 C) (Oral)   Resp 18   SpO2 97%   Visual Acuity Right Eye Distance:   Left Eye Distance:   Bilateral Distance:    Right Eye Near:   Left Eye Near:    Bilateral Near:     Physical Exam Vitals signs and nursing note reviewed.  Constitutional:      General: He is not in acute distress.    Appearance: Normal appearance. He is not ill-appearing, toxic-appearing or diaphoretic.  HENT:     Head: Normocephalic and atraumatic.     Nose: Nose normal.     Mouth/Throat:     Pharynx: Oropharynx is clear.  Eyes:     Conjunctiva/sclera: Conjunctivae normal.  Neck:     Musculoskeletal: Normal range of motion.  Pulmonary:     Effort:  Pulmonary effort is normal.  Musculoskeletal: Normal range of motion.        General: Swelling and tenderness present.     Comments: Swelling and tenderness to the right knee mostly suprapatellar.  No erythema or bruising.  No deformity.  Skin:    General: Skin is warm and dry.     Capillary Refill: Capillary refill takes less than 2 seconds.  Neurological:     Mental Status: He is alert.  Psychiatric:        Mood and Affect: Mood normal.      UC Treatments / Results  Labs (all labs ordered are listed, but only abnormal results are displayed) Labs Reviewed - No data to display  EKG   Radiology No results found.  Procedures Procedures (including critical care time)  Medications Ordered in UC Medications  triamcinolone acetonide (KENALOG-40) 40 MG/ML injection (has no administration in time range)  Benzocaine (HURRCAINE) 20 % mouth spray (has no administration in time range)    Initial Impression / Assessment and Plan / UC Course  I have reviewed the triage vital signs and the nursing notes.  Pertinent labs & imaging results that were available during my care of the patient were reviewed by me and considered in my medical decision making (see chart for details).    Knee pain and swelling.  Attempted joint aspiration of the right knee without any return of synovial fluids.  Most likely swelling is soft tissue. Treating for osteoarthritis with prednisone taper Ace wrap applied in clinic. Instructed to ice, elevate and rest the knee multiple times a day Recommended follow-up for any continued or worsening problems.  Final Clinical Impressions(s) / UC Diagnoses   Final diagnoses:  Pain and swelling of knee, right     Discharge Instructions     Use the prednisone as prescribed. Ace wrap applied here and rest and ice the knee a few times a day. Follow-up with Ortho for any continued or worsening problems    ED Prescriptions    Medication Sig Dispense Auth.  Provider   predniSONE (STERAPRED UNI-PAK 21 TAB) 10  MG (21) TBPK tablet 6 tabs for 1 day, then 5 tabs for 1 das, then 4 tabs for 1 day, then 3 tabs for 1 day, 2 tabs for 1 day, then 1 tab for 1 day 21 tablet Jaci LazierBast, Margreat Widener A, NP     Controlled Substance Prescriptions [place] Controlled Substance Registry consulted? Not Applicable   Janace ArisBast, Joliyah Lippens A, NP 02/02/19 [phone]

## 2019-02-02 NOTE — Discharge Instructions (Addendum)
Use the prednisone as prescribed. Ace wrap applied here and rest and ice the knee a few times a day. Follow-up with Ortho for any continued or worsening problems

## 2019-09-04 ENCOUNTER — Ambulatory Visit (INDEPENDENT_AMBULATORY_CARE_PROVIDER_SITE_OTHER): Payer: BC Managed Care – PPO | Admitting: Family Medicine

## 2019-09-04 ENCOUNTER — Other Ambulatory Visit: Payer: Self-pay

## 2019-09-04 VITALS — BP 128/76 | HR 77 | Wt 215.0 lb

## 2019-09-04 DIAGNOSIS — R972 Elevated prostate specific antigen [PSA]: Secondary | ICD-10-CM | POA: Diagnosis not present

## 2019-09-04 NOTE — Patient Instructions (Signed)
Dear Joseph Singleton,   It was good to see you! Thank you for taking your time to come in to be seen. Today, we discussed the following:   Prostate Check   Will call you with the PSA test results and what we'll need to do from there   Return if you start to experience any urinary symptoms   Be well,   Genia Hotter, M.D   [facility] [phone]  *Sign up for MyChart for instant access to your health profile, labs, orders, upcoming appointments or to contact your provider with questions*  ===================================================================================

## 2019-09-04 NOTE — Progress Notes (Signed)
    SUBJECTIVE:   CHIEF COMPLAINT / HPI:   Prostate follow-up Patient returning for annual prostate exam.  He denies any urinary issues at this time.  He does not have any pain with bowel movements.  Previously, patient with abnormal PSA in 2016 which doubled from the previous year.  We have been continuing to follow his PSAs.  PERTINENT  PMH / PSH: Hypertension, BMI 27.6, ED  OBJECTIVE:   BP 128/76   Pulse 77   Wt 215 lb (97.5 kg)   SpO2 99%   BMI 27.60 kg/m   Well-appearing male, no acute distress Rectal: Anus appears normal on exam. There are no hemorrhoids, rashes or trauma appreciated. On DRE, there is normal sphincter tone and no masses appreciated. No internal hemorrhoids palpated. There is stool in the rectum.  Prostate is flat with no nodules appreciated.  Size approximated by palpation to 3 and half centimeters.  Chaperone present.  Patient tolerated exam well.  ASSESSMENT/PLAN:   Abnormal PSA Patient returning today for annual prostate exam.  Explained to patient that guidelines do not recommend annual prostate exam.  However, guidelines for laboratory screening differ between associations.  Explained that he should follow-up if he has any urinary issues which may suggest BPH.  Give him the option for DRE and patient wanted to get DRE.  No abnormalities on DRE.  We will draw PSA today and continue to follow annually.  For the American Cancer Society (ACS), for men ? 56 years old: if PSA > 4 ng/mL, annual screening is recommended and further evaluation or biopsy is reasonable if PSA level 2.5-4 ng/mL, annual screening is recommended but individualized risk assessment should be considered if PSA level < 2.5 ng/mL, screening every 2 years recommended     Melene Plan, MD [facility]

## 2019-09-05 LAB — PSA: Prostate Specific Ag, Serum: 3.1 ng/mL (ref 0.0–4.0)

## 2019-09-06 ENCOUNTER — Encounter: Payer: Self-pay | Admitting: Family Medicine

## 2019-09-06 NOTE — Assessment & Plan Note (Addendum)
Patient returning today for annual prostate exam.  Explained to patient that guidelines do not recommend annual prostate exam.  However, guidelines for laboratory screening differ between associations.  Explained that he should follow-up if he has any urinary issues which may suggest BPH.  Give him the option for DRE and patient wanted to get DRE.  No abnormalities on DRE.  We will draw PSA today and continue to follow annually.  For the American Cancer Society (ACS), for men ? 56 years old: if PSA > 4 ng/mL, annual screening is recommended and further evaluation or biopsy is reasonable if PSA level 2.5-4 ng/mL, annual screening is recommended but individualized risk assessment should be considered if PSA level < 2.5 ng/mL, screening every 2 years recommended

## 2019-12-07 ENCOUNTER — Encounter: Payer: BC Managed Care – PPO | Admitting: Family Medicine

## 2019-12-13 ENCOUNTER — Encounter: Payer: Self-pay | Admitting: Family Medicine

## 2019-12-13 ENCOUNTER — Other Ambulatory Visit: Payer: Self-pay

## 2019-12-13 ENCOUNTER — Ambulatory Visit (INDEPENDENT_AMBULATORY_CARE_PROVIDER_SITE_OTHER): Payer: BC Managed Care – PPO | Admitting: Family Medicine

## 2019-12-13 VITALS — BP 130/82 | HR 74 | Ht 74.0 in | Wt 216.4 lb

## 2019-12-13 DIAGNOSIS — E663 Overweight: Secondary | ICD-10-CM

## 2019-12-13 DIAGNOSIS — I10 Essential (primary) hypertension: Secondary | ICD-10-CM | POA: Diagnosis not present

## 2019-12-13 DIAGNOSIS — Z1211 Encounter for screening for malignant neoplasm of colon: Secondary | ICD-10-CM | POA: Diagnosis not present

## 2019-12-13 NOTE — Assessment & Plan Note (Signed)
Patient reports he is awaiting call from GI office to schedule colonoscopy.

## 2019-12-13 NOTE — Patient Instructions (Signed)
Healthy Eating °Following a healthy eating pattern may help you to achieve and maintain a healthy body weight, reduce the risk of chronic disease, and live a long and productive life. It is important to follow a healthy eating pattern at an appropriate calorie level for your body. Your nutritional needs should be met primarily through food by choosing a variety of nutrient-rich foods. °What are tips for following this plan? °Reading food labels °· Read labels and choose the following: °? Reduced or low sodium. °? Juices with 100% fruit juice. °? Foods with low saturated fats and high polyunsaturated and monounsaturated fats. °? Foods with whole grains, such as whole wheat, cracked wheat, [place] rice, and wild rice. °? Whole grains that are fortified with folic acid. This is recommended for women who are pregnant or who want to become pregnant. °· Read labels and avoid the following: °? Foods with a lot of added sugars. These include foods that contain [place] sugar, corn sweetener, corn syrup, dextrose, fructose, glucose, high-fructose corn syrup, honey, invert sugar, lactose, malt syrup, maltose, molasses, raw sugar, sucrose, trehalose, or turbinado sugar. °§ Do not eat more than the following amounts of added sugar per day: °§ 6 teaspoons (25 g) for women. °§ 9 teaspoons (38 g) for men. °? Foods that contain processed or refined starches and grains. °? Refined grain products, such as white flour, degermed cornmeal, white bread, and white rice. °Shopping °· Choose nutrient-rich snacks, such as vegetables, whole fruits, and nuts. Avoid high-calorie and high-sugar snacks, such as potato chips, fruit snacks, and candy. °· Use oil-based dressings and spreads on foods instead of solid fats such as butter, stick margarine, or cream cheese. °· Limit pre-made sauces, mixes, and "instant" products such as flavored rice, instant noodles, and ready-made pasta. °· Try more plant-protein sources, such as tofu, tempeh, black beans,  edamame, lentils, nuts, and seeds. °· Explore eating plans such as the Mediterranean diet or vegetarian diet. °Cooking °· Use oil to sauté or stir-fry foods instead of solid fats such as butter, stick margarine, or lard. °· Try baking, boiling, grilling, or broiling instead of frying. °· Remove the fatty part of meats before cooking. °· Steam vegetables in water or broth. °Meal planning ° °· At meals, imagine dividing your plate into fourths: °? One-half of your plate is fruits and vegetables. °? One-fourth of your plate is whole grains. °? One-fourth of your plate is protein, especially lean meats, poultry, eggs, tofu, beans, or nuts. °· Include low-fat dairy as part of your daily diet. °Lifestyle °· Choose healthy options in all settings, including home, work, school, restaurants, or stores. °· Prepare your food safely: °? Wash your hands after handling raw meats. °? Keep food preparation surfaces clean by regularly washing with hot, soapy water. °? Keep raw meats separate from ready-to-eat foods, such as fruits and vegetables. °? Cook seafood, meat, poultry, and eggs to the recommended internal temperature. °? Store foods at safe temperatures. In general: °§ Keep cold foods at 40°F (4.4°C) or below. °§ Keep hot foods at 140°F (60°C) or above. °§ Keep your freezer at 0°F (-17.8°C) or below. °§ Foods are no longer safe to eat when they have been between the temperatures of 40°-140°F (4.4-60°C) for more than 2 hours. °What foods should I eat? °Fruits °Aim to eat 2 cup-equivalents of fresh, canned (in natural juice), or frozen fruits each day. Examples of 1 cup-equivalent of fruit include 1 small apple, 8 large strawberries, 1 cup canned fruit, ½ cup   dried fruit, or 1 cup 100% juice. Vegetables Aim to eat 2-3 cup-equivalents of fresh and frozen vegetables each day, including different varieties and colors. Examples of 1 cup-equivalent of vegetables include 2 medium carrots, 2 cups raw, leafy greens, 1 cup chopped  vegetable (raw or cooked), or 1 medium baked potato. Grains Aim to eat 6 ounce-equivalents of whole grains each day. Examples of 1 ounce-equivalent of grains include 1 slice of bread, 1 cup ready-to-eat cereal, 3 cups popcorn, or  cup cooked rice, pasta, or cereal. Meats and other proteins Aim to eat 5-6 ounce-equivalents of protein each day. Examples of 1 ounce-equivalent of protein include 1 egg, 1/2 cup nuts or seeds, or 1 tablespoon (16 g) peanut butter. A cut of meat or fish that is the size of a deck of cards is about 3-4 ounce-equivalents.  Of the protein you eat each week, try to have at least 8 ounces come from seafood. This includes salmon, trout, herring, and anchovies. Dairy Aim to eat 3 cup-equivalents of fat-free or low-fat dairy each day. Examples of 1 cup-equivalent of dairy include 1 cup (240 mL) milk, 8 ounces (250 g) yogurt, 1 ounces (44 g) natural cheese, or 1 cup (240 mL) fortified soy milk. Fats and oils  Aim for about 5 teaspoons (21 g) per day. Choose monounsaturated fats, such as canola and olive oils, avocados, peanut butter, and most nuts, or polyunsaturated fats, such as sunflower, corn, and soybean oils, walnuts, pine nuts, sesame seeds, sunflower seeds, and flaxseed. Beverages  Aim for six 8-oz glasses of water per day. Limit coffee to three to five 8-oz cups per day.  Limit caffeinated beverages that have added calories, such as soda and energy drinks.  Limit alcohol intake to no more than 1 drink a day for nonpregnant women and 2 drinks a day for men. One drink equals 12 oz of beer (355 mL), 5 oz of wine (148 mL), or 1 oz of hard liquor (44 mL). Seasoning and other foods  Avoid adding excess amounts of salt to your foods. Try flavoring foods with herbs and spices instead of salt.  Avoid adding sugar to foods.  Try using oil-based dressings, sauces, and spreads instead of solid fats. This information is based on general [place] nutrition guidelines. For more  information, visit BuildDNA.es. Exact amounts may vary based on your nutrition needs. Summary  A healthy eating plan may help you to maintain a healthy weight, reduce the risk of chronic diseases, and stay active throughout your life.  Plan your meals. Make sure you eat the right portions of a variety of nutrient-rich foods.  Try baking, boiling, grilling, or broiling instead of frying.  Choose healthy options in all settings, including home, work, school, restaurants, or stores. This information is not intended to replace advice given to you by your health care provider. Make sure you discuss any questions you have with your health care provider. Document Revised: 10/03/2017 Document Reviewed: 10/03/2017 Elsevier Patient Education  [employer].

## 2019-12-13 NOTE — Progress Notes (Signed)
°  Established Patient - Annual Preventative Physical Subjective  Patient ID: MRN [id number]   Date of birth: [birth year]    PCP: Melene Plan, MD  CC: Annual Preventative Physical    HPI: Patient has no complaints today.  HISTORY Medications, allergies, medical history, family history and social history were reviewed and edited as necessary. Pertinent findings included in HPI. Pertinent Changes in History:  Medical: No new changes Surgical: none  Family: none  Social: none  Darean reports that he quit smoking about 12 years ago. His smoking use included cigarettes. He has a 10.00 pack-year smoking history. He has never used smokeless tobacco. He reports current alcohol use. He reports that he does not use drugs. ROS: See HPI  Objective  Physical Exam:  BP 130/82    Pulse 74    Ht 6\' 2"  (1.88 m)    Wt 216 lb 6.4 oz (98.2 kg)    SpO2 97%    BMI 27.78 kg/m  Gen: NAD, alert, non-toxic, well-nourished, well-appearing, pleasant HEENT: Normocephaic, atraumatic. PERRLA, clear conjuctiva, no scleral icterus and injection. Normal EOM.  Hearing intact. TM pearly grey bilaterally with no fluid.  Neck supple with no LAD, nodules, or gross abnormality.  Nares patent with no discharge.  Maxillary and frontal sinuses nontender to palpation.  Oropharynx without erythema and lesions.  Tonsils nonswollen and without exudate.   CV: Regular rate and rhythm.  Normal S1-S2.  No murmur, gallops, S3, S4 appreciated.  Normal capillary refill bilaterally.  Radial pulses 2+ bilaterally. No bilateral lower extremity edema. Resp: Clear to auscultation bilaterally.  No wheezing, rales, rhonchi, or other abnormal lung sounds.  No increased work of breathing appreciated. Abd: Nontender and nondistended on palpation to all 4 quadrants.  Positive bowel sounds. Skin: No obvious rashes, lesions, or trauma.  Normal turgor.  MSK: Normal ROM. Normal strength and tone.  Neuro: Cranial nerves II through VI grossly intact. Gait  normal.  Alert and oriented x4.  No obvious abnormal movements. Psych: Cooperative with exam.  Normal speech. Pleasant. Makes good eye contact. Genitourinary: deferred.    Assessment & Plan  Screening for colon cancer Patient reports he is awaiting call from GI office to schedule colonoscopy.  Essential hypertension, benign Currently well controlled.  No changes in medication at this time.  Follow-up in 6 months for next high blood pressure visit or sooner as needed.  Overweight (BMI 25.0-29.9) Gave patient more information about healthy nutrition and encouraged exercise.  Encourage patient to obtain Covid vaccine, however, patient is reluctant as the vaccine is so new and there are no known long-term effects.  05-07-2001, M.D.  6:23 PM 12/13/2019

## 2019-12-13 NOTE — Assessment & Plan Note (Signed)
Gave patient more information about healthy nutrition and encouraged exercise.

## 2019-12-13 NOTE — Assessment & Plan Note (Signed)
Currently well controlled.  No changes in medication at this time.  Follow-up in 6 months for next high blood pressure visit or sooner as needed.

## 2019-12-17 ENCOUNTER — Other Ambulatory Visit: Payer: Self-pay | Admitting: Family Medicine

## 2019-12-17 DIAGNOSIS — I10 Essential (primary) hypertension: Secondary | ICD-10-CM

## 2020-03-14 DIAGNOSIS — Z1159 Encounter for screening for other viral diseases: Secondary | ICD-10-CM | POA: Diagnosis not present

## 2020-03-18 DIAGNOSIS — D124 Benign neoplasm of descending colon: Secondary | ICD-10-CM | POA: Diagnosis not present

## 2020-03-18 DIAGNOSIS — D123 Benign neoplasm of transverse colon: Secondary | ICD-10-CM | POA: Diagnosis not present

## 2020-03-18 DIAGNOSIS — Z86010 Personal history of colonic polyps: Secondary | ICD-10-CM | POA: Diagnosis not present

## 2020-06-02 ENCOUNTER — Other Ambulatory Visit: Payer: Self-pay | Admitting: Family Medicine

## 2020-06-02 DIAGNOSIS — I10 Essential (primary) hypertension: Secondary | ICD-10-CM

## 2020-08-20 ENCOUNTER — Other Ambulatory Visit: Payer: Self-pay | Admitting: Family Medicine

## 2020-08-20 DIAGNOSIS — I10 Essential (primary) hypertension: Secondary | ICD-10-CM

## 2021-03-02 ENCOUNTER — Ambulatory Visit (INDEPENDENT_AMBULATORY_CARE_PROVIDER_SITE_OTHER): Payer: BC Managed Care – PPO | Admitting: Family Medicine

## 2021-03-02 VITALS — BP 163/75 | HR 73

## 2021-03-02 DIAGNOSIS — R0981 Nasal congestion: Secondary | ICD-10-CM | POA: Diagnosis not present

## 2021-03-02 DIAGNOSIS — R058 Other specified cough: Secondary | ICD-10-CM | POA: Diagnosis not present

## 2021-03-02 MED ORDER — CETIRIZINE HCL 10 MG PO TABS: 10.0000 mg | ORAL_TABLET | Freq: Every day | ORAL | 11 refills | Status: DC

## 2021-03-02 MED ORDER — FLUTICASONE PROPIONATE 50 MCG/ACT NA SUSP: 2.0000 | Freq: Every day | NASAL | 6 refills | Status: DC

## 2021-03-02 NOTE — Progress Notes (Addendum)
    SUBJECTIVE:   CHIEF COMPLAINT / HPI:   Joseph Singleton (MRN: [id number]) is a 55 y.o. male with a history of HTN who presents for evaluation of sinus congestion and productive cough.  Sinus congestion and productive cough Patient reports symptoms started 2-3 weeks ago. He states he experiences pain above and below his eyes occasionally in his sinuses. He denies any nasal discharge, but he does produce clear and sometimes yellow-green sputum with his cough. He has not had a sore throat or drainage. He has not taken any medication to help the cough and congestion. He does not have a history of allergies. He has no known sick contacts. He denies fever, chills, SOB, chest pain, nausea, vomiting, or bowel changes.  PERTINENT  PMH / PSH: He has received 2 COVID-19 vaccines. 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 He quit smoking a couple years ago.  OBJECTIVE:   BP (!) 163/75   Pulse 73   SpO2 99%    PHYSICAL EXAM  GEN: Well-developed, in NAD HEAD: NCAT EENT: Mild erythema of nasal turbinates and oropharynx without lesions or exudates CVS: RRR, normal S1/S2, no murmurs, rubs, gallops RESP: Breathing comfortably on RA, no retractions, wheezes, rhonchi, or crackles EXT: Moves all extremities grossly equally    ASSESSMENT/PLAN:   Cough with congestion of paranasal sinus The duration and characteristics of patient's symptoms could be explained by lingering effects of COVID-19 infection, though he endorses no sick contacts or fever over the period of illness. The duration of his symptoms could also be due to a bacterial sinusitis, though would expect fever and more ill-appearance on exam. Allergies could cause this presentation as well given his work in the [employer], though his environment is not new for him, and he does not have a history of allergies. An upper respiratory infection or pulmonary process secondary to smoking is less likely given no SOB, chest pain, and  normal respiratory exam. Will test for COVID-19 and, if positive, will treat symptomatically to control cough and congestion. If negative and symptoms persist, will consider Augmentin for 5 days to treat suspected bacterial sinusitis.   Samantha Crimes, Medical Student [facility]  Resident Attestation  I saw and evaluated the patient, performing the key elements of the service.I  personally performed or re-performed the history, physical exam, and medical decision making activities of this service and have verified that the service and findings are accurately documented in the student's note. I developed the management plan that is described in the medical student's note, and I agree with the content, with my edits above.    Derrel Nip, PGY3

## 2021-03-02 NOTE — Assessment & Plan Note (Addendum)
The duration and characteristics of patient's symptoms could be explained by lingering effects of COVID-19 infection, though he endorses no sick contacts or fever over the period of illness. The duration of his symptoms could also be due to a bacterial sinusitis, though would expect fever and more ill-appearance on exam. Allergies could cause this presentation as well given his work in the [employer], though his environment is not new for him, and he does not have a history of allergies. An upper respiratory infection or pulmonary process secondary to smoking is less likely given no SOB, chest pain, and normal respiratory exam. Will test for COVID-19 and, if positive, will treat symptomatically to control cough and congestion. If negative and symptoms persist, will consider Augmentin for 5 days to treat suspected bacterial sinusitis.

## 2021-03-02 NOTE — Patient Instructions (Signed)
It was great to see you today!  Here's what we talked about:  You came in with sinus congestion and a productive cough. Because you have had these symptoms for 2-3 weeks, this could be the effects after a COVID infection. It could also be a bacterial infection. If your COVID test is negative, we may need antibiotics to see if your symptoms resolve. We will call you when we have your COVID test results.  Take care and seek immediate care sooner if you develop any concerns.  Joseph Singleton "Branden" Naseem Adler, MS3 [facility]

## 2021-03-03 LAB — SARS-COV-2, NAA 2 DAY TAT

## 2021-03-03 LAB — NOVEL CORONAVIRUS, NAA: SARS-CoV-2, NAA: NOT DETECTED

## 2021-03-19 ENCOUNTER — Encounter: Payer: BC Managed Care – PPO | Admitting: Student

## 2021-03-19 NOTE — Progress Notes (Deleted)
    SUBJECTIVE:   Chief compliant/HPI: annual examination  Joseph Singleton is a 55 y.o. who presents today for an annual exam.   History tabs reviewed and updated.   Review of systems form reviewed and notable for ***.   OBJECTIVE:   There were no vitals taken for this visit.  ***  ASSESSMENT/PLAN:   No problem-specific Assessment & Plan notes found for this encounter.    Annual Examination  See AVS for age appropriate recommendations.  PHQ score ***, reviewed and discussed.  Blood pressure value is *** goal, discussed.   Considered the following screening exams based upon USPSTF recommendations: Diabetes screening: {discussed/ordered:14545} Screening for elevated cholesterol: {discussed/ordered:14545} HIV testing: {discussed/ordered:14545} Hepatitis C: {discussed/ordered:14545} Hepatitis B: {discussed/ordered:14545} Syphilis if at high risk: {discussed/ordered:14545} Reviewed risk factors for latent tuberculosis and {not indicated/requested/declined:14582} Colorectal cancer screening: {crcscreen:23821::"discussed, colonoscopy ordered"} Lung cancer screening: {discussed/declined/written DHDI:97847} See documentation below regarding discussion and indication.  PSA discussed and after engaging in discussion of possible risks, benefits and complications of screening patient elected to ***.   Follow up in 1 year or sooner if indicated.    Darral Dash, DO [facility]

## 2021-06-10 ENCOUNTER — Encounter: Payer: Self-pay | Admitting: *Deleted

## 2021-06-12 ENCOUNTER — Encounter: Payer: BC Managed Care – PPO | Admitting: Student

## 2021-06-30 ENCOUNTER — Other Ambulatory Visit: Payer: Self-pay

## 2021-06-30 ENCOUNTER — Ambulatory Visit (INDEPENDENT_AMBULATORY_CARE_PROVIDER_SITE_OTHER): Payer: BC Managed Care – PPO | Admitting: Student

## 2021-06-30 ENCOUNTER — Encounter: Payer: Self-pay | Admitting: Student

## 2021-06-30 VITALS — BP 170/110 | HR 81 | Ht 74.0 in | Wt 210.0 lb

## 2021-06-30 DIAGNOSIS — I10 Essential (primary) hypertension: Secondary | ICD-10-CM

## 2021-06-30 DIAGNOSIS — Z0000 Encounter for general adult medical examination without abnormal findings: Secondary | ICD-10-CM | POA: Diagnosis not present

## 2021-06-30 DIAGNOSIS — Z1211 Encounter for screening for malignant neoplasm of colon: Secondary | ICD-10-CM

## 2021-06-30 DIAGNOSIS — Z0001 Encounter for general adult medical examination with abnormal findings: Secondary | ICD-10-CM | POA: Diagnosis not present

## 2021-06-30 MED ORDER — AMLODIPINE BESYLATE 10 MG PO TABS: 10.0000 mg | ORAL_TABLET | Freq: Every day | ORAL | 0 refills | Status: DC

## 2021-06-30 NOTE — Assessment & Plan Note (Signed)
Colonoscopy completed in 2021- plan for follow-up in 5 years.

## 2021-06-30 NOTE — Progress Notes (Signed)
° ° °  SUBJECTIVE:   Chief compliant/HPI: annual examination  Joseph Singleton is a 55 y.o. who presents today for an annual exam.   HPI: No complaints today including chest pain, headache, shortness of breath, visual changes.  History tabs reviewed and updated.  Pertinent changes in history: Medical: BP elevated today to 179/104 with re-check 15 minutes later of 170/110. Has been out of Amlodipine for a few weeks. Asymptomatic. Surgical: None Social: None. No current cigarette use- has 10.00 pack-year smoking history, quit 13 years ago. No alcohol or ilicit drug use.  OBJECTIVE:   BP (!) 170/110 Comment: manual right arm, large cuff   Pulse 81    Ht 6\' 2"  (1.88 m)    Wt 210 lb (95.3 kg)    BMI 26.96 kg/m  General: Well-appearing gentleman, appears stated age, pleasant, in no distress HEENT: Normocephalic, atraumatic. Pupils PERRLA. No conjunctivitis or scleral icterus. Mouth clear with MMM. CV: Regular rate and rhythm with no murmurs appreciated. No lower extremity edema Resp: Normal work of breathing on room air. Clear in all lung fields. No wheezing, crackles, rales. Abdomen: Soft, non-distended Skin: Warm, dry, well-perfused Neurologic: Alert and oriented x4, speech clear and fluent. No focal neurologic deficits. Psych: Normal mood and affect. Makes good eye contact. Pleasant and conversational. GU: Deferred   ASSESSMENT/PLAN:   Screening for colon cancer Colonoscopy completed in 2021- plan for follow-up in 5 years.  Essential hypertension, benign BP elevated today in clinic to 170s/100-110s. Patient has not been taking Amlodipine as prescribed since he ran out a few weeks ago. Remains asymptomatic. Discussed importance of taking medication to prevent adverse events/outcomes from hypertension, patient understood and agreed. - Obtain BMP today, will follow-up on this - BP check in 2 weeks; will change medications as appropriate if his blood pressure remains elevated - Amlodipine  10mg  sent in to pharmacy  Healthcare maintenance UTD on COVID vaccinations, will send photo of COVID card to clinic to update file. Declined Influenza and Shingles vaccines today.     2022, DO [facility]

## 2021-06-30 NOTE — Patient Instructions (Addendum)
It was great to see you! Thank you for allowing me to participate in your care!  I recommend that you always bring your medications to each appointment as this makes it easy to ensure we are on the correct medications and helps [place] not miss when refills are needed.  Our plans for today:  - I sent in Amlodipine (Norvasc) to your pharmacy. Please resume taking this medication- it is very important to keep your blood pressure within normal limits - Please follow-up in 2 weeks for a blood pressure check  We are checking some labs today, I will call you if they are abnormal will send you a MyChart message or a letter if they are normal.  If you do not hear about your labs in the next 2 weeks please let us know.  Take care and seek immediate care sooner if you develop any concerns.   Dr. Darral Dash [facility]

## 2021-06-30 NOTE — Assessment & Plan Note (Addendum)
BP elevated today in clinic to 170s/100-110s. Patient has not been taking Amlodipine as prescribed since he ran out a few weeks ago. Remains asymptomatic. Discussed importance of taking medication to prevent adverse events/outcomes from hypertension, patient understood and agreed. - Obtain BMP today, will follow-up on this - BP check in 2 weeks; will change medications as appropriate if his blood pressure remains elevated - Amlodipine 10mg  sent in to pharmacy

## 2021-06-30 NOTE — Assessment & Plan Note (Signed)
UTD on COVID vaccinations, will send photo of COVID card to clinic to update file. Declined Influenza and Shingles vaccines today.

## 2021-07-01 LAB — BASIC METABOLIC PANEL
BUN/Creatinine Ratio: 11 (ref 9–20)
BUN: 11 mg/dL (ref 6–24)
CO2: 25 mmol/L (ref 20–29)
Calcium: 10.2 mg/dL (ref 8.7–10.2)
Chloride: 102 mmol/L (ref 96–106)
Creatinine, Ser: 0.98 mg/dL (ref 0.76–1.27)
Glucose: 90 mg/dL (ref 70–99)
Potassium: 4.3 mmol/L (ref 3.5–5.2)
Sodium: 139 mmol/L (ref 134–144)
eGFR: 90 mL/min/{1.73_m2} (ref >59)

## 2021-10-06 ENCOUNTER — Other Ambulatory Visit: Payer: Self-pay | Admitting: Student

## 2021-10-06 DIAGNOSIS — I10 Essential (primary) hypertension: Secondary | ICD-10-CM

## 2021-12-08 ENCOUNTER — Encounter: Payer: Self-pay | Admitting: *Deleted

## 2022-01-26 ENCOUNTER — Other Ambulatory Visit: Payer: Self-pay | Admitting: Student

## 2022-01-26 DIAGNOSIS — I10 Essential (primary) hypertension: Secondary | ICD-10-CM

## 2022-03-01 ENCOUNTER — Ambulatory Visit (INDEPENDENT_AMBULATORY_CARE_PROVIDER_SITE_OTHER): Payer: BC Managed Care – PPO | Admitting: Family Medicine

## 2022-03-01 ENCOUNTER — Encounter: Payer: Self-pay | Admitting: Family Medicine

## 2022-03-01 VITALS — BP 130/82 | HR 65 | Ht 74.0 in | Wt 204.0 lb

## 2022-03-01 DIAGNOSIS — R972 Elevated prostate specific antigen [PSA]: Secondary | ICD-10-CM

## 2022-03-01 DIAGNOSIS — R351 Nocturia: Secondary | ICD-10-CM | POA: Diagnosis not present

## 2022-03-01 DIAGNOSIS — N401 Enlarged prostate with lower urinary tract symptoms: Secondary | ICD-10-CM | POA: Diagnosis not present

## 2022-03-01 MED ORDER — TADALAFIL 5 MG PO TABS: 5.0000 mg | ORAL_TABLET | Freq: Every day | ORAL | 0 refills | Status: DC

## 2022-03-01 NOTE — Patient Instructions (Signed)
It was great to see you today! Here's what we talked about:  I believe your symptoms are due to an enlarged prostate, called BPH. I have added some information here. I have prescribed Cialis, a once daily medication that can help all of your symptoms. Take this at the same time each day. Follow up in 1 month to see how you are doing on this medicine. We will get an updated PSA and cholesterol panel today. I will follow up the results with you.  Please let me know if you have any other questions.  Dr. Phineas Real

## 2022-03-01 NOTE — Progress Notes (Signed)
    SUBJECTIVE:   CHIEF COMPLAINT / HPI:   Prostate issues Previously has abnormal PSA to 4 in 2016 from 1.55 in 2014. Subsequent PSA values have been normal, with the most recent value 3.1 in 2021. He has also had multiple normal DREs. Has been made multiple referrals for urology as well, though he did not go to those appointments. Today, he mentions having nocturia (2-3 times a night), stream is weak, and ED problems. These symptoms have been off and on for years. He does not have nocturnal erections anymore. He would like to have his PSA checked again, as he has been worried about something being wrong.  PERTINENT  PMH / PSH: HTN, perirectal abscess, ED  OBJECTIVE:   BP 130/82   Pulse 65   Ht 6\' 2"  (1.88 m)   Wt 204 lb (92.5 kg)   SpO2 98%   BMI 26.19 kg/m   General: Alert and oriented, in NAD Skin: Warm, dry, and intact without lesions HEENT: NCAT, EOM grossly normal, midline nasal septum Cardiac: Regular rate Respiratory: Breathing and speaking comfortably on RA Abdominal: Nondistended Extremities: Moves all extremities grossly equally Neurological: No gross focal deficit Psychiatric: Appropriate mood and affect   IPSS: 19 = moderately symptomatic (see media tab)  ASSESSMENT/PLAN:   Benign prostatic hyperplasia with nocturia History and previous encounters suggestive of BPH. IPSS score elevated from 6 in 2020 to 19 today. Given increase in symptoms, will start Cialis 5 mg daily. I expect the medication to improve urinary symptoms as well as ED. Will also obtain lipid panel given some risk of CAD with cialis. Will follow up in 1 month to assess response and with lipid results.  Abnormal PSA Given history of elevated PSA and increased symptoms on IPSS, will get updated PSA and follow up results.   , MD [facility]

## 2022-03-01 NOTE — Assessment & Plan Note (Signed)
Given history of elevated PSA and increased symptoms on IPSS, will get updated PSA and follow up results.

## 2022-03-01 NOTE — Assessment & Plan Note (Addendum)
History and previous encounters suggestive of BPH. IPSS score elevated from 6 in 2020 to 19 today. Given increase in symptoms, will start Cialis 5 mg daily. I expect the medication to improve urinary symptoms as well as ED. Will also obtain lipid panel given some risk of CAD with cialis. Will follow up in 1 month to assess response and with lipid results.

## 2022-03-02 ENCOUNTER — Telehealth: Payer: Self-pay | Admitting: Family Medicine

## 2022-03-02 LAB — LIPID PANEL
Chol/HDL Ratio: 3.1 ratio (ref 0.0–5.0)
Cholesterol, Total: 170 mg/dL (ref 100–199)
HDL: 55 mg/dL (ref >39)
LDL Chol Calc (NIH): 102 mg/dL — ABNORMAL HIGH (ref 0–99)
Triglycerides: 65 mg/dL (ref 0–149)
VLDL Cholesterol Cal: 13 mg/dL (ref 5–40)

## 2022-03-02 LAB — PSA: Prostate Specific Ag, Serum: 4.3 ng/mL — ABNORMAL HIGH (ref 0.0–4.0)

## 2022-03-02 NOTE — Telephone Encounter (Signed)
Attempted to call patient regarding PSA and lipid panel results. Left voicemail for patient to call back at earliest convenience.

## 2022-03-03 NOTE — Telephone Encounter (Signed)
Patient returns call to nurse line requesting to speak with provider regarding results.   Please return call to patient at [phone].  Thanks.   Veronda Prude, RN

## 2022-03-04 NOTE — Telephone Encounter (Signed)
Spoke with patient's alternate contact person/wife, Malachi Bonds, about elevated PSA and unremarkable lipid panel given patient is currently at work. Advised on two options for once-again elevated PSA: (1) watchful waiting with recheck in 2-3 months to better evaluate PSA velocity or (2) referral to urology for further evaluation (though multiple referrals have been made in the past without patient attendance). Both options are reasonable given history of fluctuations of PSA, though I suspect they are due to increased symptoms of BPH (and potentially prostatitis) versus malignancy. Wife will relay information to patient. She states Cialis is working well with his symptoms, and they would likely wait for a recheck in 2-3 months.

## 2022-04-02 ENCOUNTER — Ambulatory Visit (INDEPENDENT_AMBULATORY_CARE_PROVIDER_SITE_OTHER): Payer: BC Managed Care – PPO | Admitting: Student

## 2022-04-02 ENCOUNTER — Encounter: Payer: Self-pay | Admitting: Student

## 2022-04-02 VITALS — BP 110/80 | HR 83 | Ht 74.0 in | Wt 207.0 lb

## 2022-04-02 DIAGNOSIS — I10 Essential (primary) hypertension: Secondary | ICD-10-CM | POA: Diagnosis not present

## 2022-04-02 DIAGNOSIS — R972 Elevated prostate specific antigen [PSA]: Secondary | ICD-10-CM

## 2022-04-02 DIAGNOSIS — R351 Nocturia: Secondary | ICD-10-CM | POA: Diagnosis not present

## 2022-04-02 DIAGNOSIS — N401 Enlarged prostate with lower urinary tract symptoms: Secondary | ICD-10-CM

## 2022-04-02 DIAGNOSIS — G8929 Other chronic pain: Secondary | ICD-10-CM

## 2022-04-02 DIAGNOSIS — M170 Bilateral primary osteoarthritis of knee: Secondary | ICD-10-CM

## 2022-04-02 DIAGNOSIS — M25562 Pain in left knee: Secondary | ICD-10-CM

## 2022-04-02 DIAGNOSIS — M25561 Pain in right knee: Secondary | ICD-10-CM

## 2022-04-02 MED ORDER — HYDROCHLOROTHIAZIDE 25 MG PO TABS: 25.0000 mg | ORAL_TABLET | Freq: Every day | ORAL | 0 refills | Status: DC

## 2022-04-02 MED ORDER — TADALAFIL 5 MG PO TABS: 5.0000 mg | ORAL_TABLET | Freq: Every day | ORAL | 0 refills | Status: DC

## 2022-04-02 NOTE — Assessment & Plan Note (Signed)
Has history of elevated PSA for almost 10 years. I am concerned given that he has a history of prostate cancer in the family and has a personal history of smoking cigarettes. Needs urology referral, however he has no showed multiple times in the past with referrals. Urinary hesitancy, frequency improving on Cialis, however given that he has a decreased total PSA in the past, this does concern me.

## 2022-04-02 NOTE — Assessment & Plan Note (Addendum)
Blood pressure well controlled today, at goal.  Reading is 110/80. Continue amlodipine 10 mg daily and HCTZ 10 mg daily. I did calculate ASCVD risk score with an 8.7% risk of cardiovascular event in the next 10 years, with recommendation to start a moderate to high intensity statin.  Utilizing shared decision making with patient, he would prefer to hold off for now and just work on diet and lifestyle choices. Follow-up in 6 months

## 2022-04-02 NOTE — Patient Instructions (Signed)
It was wonderful seeing you today!  Continue taking your Cialis and blood pressure medicines.  We are checking a PSA level today. I will call with results and next steps.  Go to [facility] for knee X-rays when convenient for you.  We will plan to see you again in 6 months unless you have any other concerns.   If you have any questions or concerns, please feel free to call the clinic.    Be well,  Dr. Orvis Brill [facility] [phone]

## 2022-04-02 NOTE — Assessment & Plan Note (Signed)
Likely worsening osteoarthritis bilaterally.  No trauma, falls.  No sign of joint effusion. Previous imaging shows osteoarthritis on the left side with medial narrowing. Obtain standing bilateral x-rays today. Discussed management options for pain as well, including Tylenol arthritis.  Discussed use of corticosteroid injections after imaging.

## 2022-04-02 NOTE — Progress Notes (Signed)
    SUBJECTIVE:   CHIEF COMPLAINT / HPI:   Elevated PSA/BPH Patient reports significant improvement on Cialis 5 mg in regards to BPH symptoms.  He was waking up 4-5 times a night to urinate, but now is only waking up 1-2 times. PSA was obtained at last visit which was slightly elevated 4.3, and he does have a history of decreased free PSA.    Hypertension Takes Amlodipine 10 mg and HCTZ 25 mg daily.  Compliant with medications. No headaches, chest pain, shortness of breath, visual changes, edema.  Hyperlipidemia Not currently taking any medication.  Chronic knee pain Pain is worsening in bilateral knees.  He says sometimes he has difficulty with standing due to the pain. No locking or twisting of the knee.   PERTINENT  PMH / PSH: Reviewed  OBJECTIVE:   BP 110/80   Pulse 83   Ht 6\' 2"  (1.88 m)   Wt 207 lb (93.9 kg)   SpO2 99%   BMI 26.58 kg/m   General: Alert and cooperative and appears to be in no acute distress Cardio: Normal S1 and S2, no S3 or S4. Rhythm is regular. No murmurs or rubs.   Pulm: Clear to auscultation bilaterally, no crackles, wheezing, or diminished breath sounds. Normal respiratory effort Abdomen: Bowel sounds normal. Abdomen soft and non-tender.  Extremities: No peripheral edema. Warm/ well perfused.  Strong radial pulses. Neuro: Cranial nerves grossly intact  ASSESSMENT/PLAN:   Essential hypertension, benign Blood pressure well controlled today, at goal.  Reading is 110/80. Continue amlodipine 10 mg daily and HCTZ 10 mg daily. I did calculate ASCVD risk score with an 8.7% risk of cardiovascular event in the next 10 years, with recommendation to start a moderate to high intensity statin.  Utilizing shared decision making with patient, he would prefer to hold off for now and just work on diet and lifestyle choices. Follow-up in 6 months  Abnormal PSA Has history of elevated PSA for almost 10 years. I am concerned given that he has a history of  prostate cancer in the family and has a personal history of smoking cigarettes. Needs urology referral, however he has no showed multiple times in the past with referrals. Urinary hesitancy, frequency improving on Cialis, however given that he has a decreased total PSA in the past, this does concern me.  Chronic pain of both knees Likely worsening osteoarthritis bilaterally.  No trauma, falls.  No sign of joint effusion. Previous imaging shows osteoarthritis on the left side with medial narrowing. Obtain standing bilateral x-rays today. Discussed management options for pain as well, including Tylenol arthritis.  Discussed use of corticosteroid injections after imaging.     Orvis Brill, [place]

## 2022-04-03 LAB — PSA: Prostate Specific Ag, Serum: 5.7 ng/mL — ABNORMAL HIGH (ref 0.0–4.0)

## 2022-05-04 ENCOUNTER — Other Ambulatory Visit: Payer: Self-pay | Admitting: Student

## 2022-05-04 DIAGNOSIS — I10 Essential (primary) hypertension: Secondary | ICD-10-CM

## 2022-06-08 ENCOUNTER — Other Ambulatory Visit: Payer: Self-pay | Admitting: Student

## 2022-06-08 DIAGNOSIS — N401 Enlarged prostate with lower urinary tract symptoms: Secondary | ICD-10-CM

## 2022-08-02 ENCOUNTER — Other Ambulatory Visit: Payer: Self-pay | Admitting: Student

## 2022-08-02 DIAGNOSIS — I10 Essential (primary) hypertension: Secondary | ICD-10-CM

## 2022-08-27 ENCOUNTER — Encounter: Payer: Self-pay | Admitting: Student

## 2022-08-27 ENCOUNTER — Ambulatory Visit (INDEPENDENT_AMBULATORY_CARE_PROVIDER_SITE_OTHER): Payer: BC Managed Care – PPO | Admitting: Student

## 2022-08-27 VITALS — BP 124/82 | HR 72 | Ht 75.0 in | Wt 205.0 lb

## 2022-08-27 DIAGNOSIS — N529 Male erectile dysfunction, unspecified: Secondary | ICD-10-CM | POA: Diagnosis not present

## 2022-08-27 MED ORDER — AMLODIPINE-OLMESARTAN 10-20 MG PO TABS: 1.0000 | ORAL_TABLET | Freq: Every day | ORAL | 0 refills | Status: DC

## 2022-08-27 NOTE — Patient Instructions (Addendum)
It was great seeing you today.  I sent in a new blood pressure medicine for you that has both amlodipine-olmesartan. Take this once daily STOP taking your Amlodipine and Hydrochlorothiazide  I also sent a referral to see a urologist. If you do not hear from them in 2 weeks, please call and let me know   If you have any questions or concerns, please feel free to call the clinic.   Have a wonderful day,  Dr. Orvis Brill [facility] [phone]

## 2022-08-27 NOTE — Progress Notes (Signed)
    SUBJECTIVE:   Chief compliant/HPI: annual examination  Joseph Singleton is a 57 y.o. who presents today for an annual exam.   Regarding his erectile dysfunction, he says Cialis is not working very well for him.  He is continuing to have BPH symptoms with dribbling.  History tabs reviewed and updated.   Review of systems form reviewed and notable for see above.   OBJECTIVE:   BP 124/82   Pulse 72   Ht 6' 3"$  (1.905 m)   Wt 205 lb (93 kg)   SpO2 98%   BMI 25.62 kg/m   General: Alert and cooperative and appears to be in no acute distress Cardio: Normal S1 and S2, no S3 or S4. Rhythm is regular. No murmurs or rubs.   Pulm: Clear to auscultation bilaterally, no crackles, wheezing, or diminished breath sounds. Normal respiratory effort Abdomen: Bowel sounds normal. Abdomen soft and non-tender.  Extremities: No peripheral edema. Warm/ well perfused.  Strong radial pulses. Neuro: Cranial nerves grossly intact   ASSESSMENT/PLAN:    Annual Examination  See AVS for age appropriate recommendations.  PHQ score 0, reviewed and discussed.   Blood pressure value is at goal, discussed.  To reduce pill burden, prescribed amlodipine-olmesartan in place of amlodipine and HCTZ.  Considered the following screening exams based upon USPSTF recommendations: Diabetes screening: discussed Reviewed risk factors for latent tuberculosis and not indicated Colorectal cancer screening: up to date on screening for CRC. Lung cancer screening: done See documentation below regarding discussion and indication.  PSA discussed and after engaging in discussion of possible risks, benefits and complications of screening patient elected to defer.   Given persistence of BPH symptoms and ED, urology referral placed today.   Statin is indicated given ASCVD risk score of 10.9%.  The 10-year ASCVD risk score (Arnett DK, et al., 2019) is: 10.9%   Values used to calculate the score:     Age: 40 years     Sex:  Male     Is Non-Hispanic African American: Yes     Diabetic: No     Tobacco smoker: No     Systolic Blood Pressure: [id number] mmHg     Is BP treated: Yes     HDL Cholesterol: 55 mg/dL     Total Cholesterol: 170 mg/dL   Follow up in 6 months for blood pressure check.  Orvis Brill, [place]

## 2022-09-02 ENCOUNTER — Telehealth: Payer: Self-pay | Admitting: Student

## 2022-09-02 NOTE — Telephone Encounter (Signed)
Patients wife Halsey Crumedy) called checking the status of paperwork for the [place]. I did not see any paper work in the doctors box, the already faxed pile or up front. Patients wife stated it has been 2 days, I let her know about the 5 days and told her I would send a message back checking the status.   She would like a phone call when paperwork is completed.   Please Advise.   Thanks!

## 2022-09-02 NOTE — Telephone Encounter (Signed)
Dr Owens Shark have you seen this paperwork?  I did not see a message saying we received the paperwork up front. Ottis Stain, CMA

## 2022-09-07 NOTE — Telephone Encounter (Signed)
Patients wife is calling to check on the status of [employer] letter being completed. I informed her Dr. Owens Shark has documented that she is completing form.   Please call patients wife when form is complete and ready to be picked up at the front desk.

## 2022-09-08 ENCOUNTER — Ambulatory Visit (INDEPENDENT_AMBULATORY_CARE_PROVIDER_SITE_OTHER): Payer: BC Managed Care – PPO | Admitting: Urology

## 2022-09-08 ENCOUNTER — Encounter: Payer: Self-pay | Admitting: Urology

## 2022-09-08 ENCOUNTER — Telehealth: Payer: Self-pay | Admitting: Student

## 2022-09-08 VITALS — BP 131/86 | HR 82 | Ht 75.0 in | Wt 205.0 lb

## 2022-09-08 DIAGNOSIS — N529 Male erectile dysfunction, unspecified: Secondary | ICD-10-CM | POA: Diagnosis not present

## 2022-09-08 DIAGNOSIS — R972 Elevated prostate specific antigen [PSA]: Secondary | ICD-10-CM | POA: Diagnosis not present

## 2022-09-08 DIAGNOSIS — N401 Enlarged prostate with lower urinary tract symptoms: Secondary | ICD-10-CM

## 2022-09-08 MED ORDER — SILDENAFIL CITRATE 100 MG PO TABS: 100.0000 mg | ORAL_TABLET | ORAL | 0 refills | Status: DC | PRN

## 2022-09-08 MED ORDER — TAMSULOSIN HCL 0.4 MG PO CAPS: 0.4000 mg | ORAL_CAPSULE | Freq: Every day | ORAL | 3 refills | Status: DC

## 2022-09-08 NOTE — Addendum Note (Signed)
Addended by: Pamala Hurry on: 09/08/2022 10:58 AM   Modules accepted: Orders

## 2022-09-08 NOTE — Progress Notes (Signed)
Assessment: 1. Benign localized prostatic hyperplasia with lower urinary tract symptoms (LUTS)   2. Elevated PSA   3. Erectile dysfunction of organic origin      Plan: Today I had a long discussion with the patient regarding his urologic issues including significant lower urinary tract symptoms, elevated PSA and ED.  I discussed with him additional medical management options as well as further evaluation. Following our discussion, patient elects to begin new/alternate medical therapy. Rx: Sildenafil 100 mg as needed Rx: Tamsulosin 0.4 mg daily Nature of medications including proper utilization as well as potential adverse events and side effects reviewed.  Patient will return in approximately 2 months for symptom assessment, residual urine determination and importantly a repeat PSA (f/t) prior to visit.  Chief Complaint: luts and ED  History of Present Illness:  Joseph Singleton is a 57 y.o. male who is seen in consultation from Orvis Brill, DO for evaluation of LUTS and ED.  He has been taking cialis '5mg'$  daily. Patient also has a history of an abnormal PSA dating back almost 10 years years and was actually previously referred to urology on multiple occasions in the past but never followed through with the evaluation.  Most recent PSA 03/2022 = 5.7 He does NOT have a family history of prostate cancer but several brothers have BPH. DRE reveals a large (50gm) benign feeling gland.  Patient reports gradual worsening lower urinary tract symptoms over the last few years.  Current IPSS = 20/5 Patient also has significant erectile dysfunction.  He has recently tried daily Cialis for a few months without noticeable benefit in either his ED or lower urinary tract symptoms.    Past Medical History:  Past Medical History:  Diagnosis Date   Hypertension    Perirectal abscess 06/07/2011   Required I&D, most recently 05/2011 in ED.     Rhinitis medicamentosa 06/23/2012    Past Surgical  History:  Past Surgical History:  Procedure Laterality Date   TESTICLE SURGERY     unsure of reason   THUMB AMPUTATION     partial left-post accident    Allergies:  No Known Allergies  Family History:  Family History  Problem Relation Age of Onset   Heart disease Mother    Hypertension Father    Diabetes Father    Cancer Maternal Aunt        breast   Hypertension Brother    Diabetes Brother    Asthma Sister    Lupus Sister     Social History:  Social History   Tobacco Use   Smoking status: Former    Packs/day: 1.00    Years: 10.00    Total pack years: 10.00    Types: Cigarettes    Quit date: 06/05/2007    Years since quitting: 15.2   Smokeless tobacco: Never  Substance Use Topics   Alcohol use: Yes    Comment: <1/week   Drug use: No    Review of symptoms:  Constitutional:  Negative for unexplained weight loss, night sweats, fever, chills ENT:  Negative for nose bleeds, sinus pain, painful swallowing CV:  Negative for chest pain, shortness of breath, exercise intolerance, palpitations, loss of consciousness Resp:  Negative for cough, wheezing, shortness of breath GI:  Negative for nausea, vomiting, diarrhea, bloody stools GU:  Positives noted in HPI; otherwise negative for gross hematuria, dysuria, urinary incontinence Neuro:  Negative for seizures, poor balance, limb weakness, slurred speech Psych:  Negative for lack of energy, depression, anxiety Endocrine:  Negative for polydipsia, polyuria, symptoms of hypoglycemia (dizziness, hunger, sweating) Hematologic:  Negative for anemia, purpura, petechia, prolonged or excessive bleeding, use of anticoagulants  Allergic:  Negative for difficulty breathing or choking as a result of exposure to anything; no shellfish allergy; no allergic response (rash/itch) to materials, foods  Physical exam: BP 131/86   Pulse 82   Ht '6\' 3"'$  (1.905 m)   Wt 205 lb (93 kg)   BMI 25.62 kg/m  GENERAL APPEARANCE:  Well appearing,  well developed, well nourished, NAD HEENT: Atraumatic, Normocephalic, oropharynx clear.  ABDOMEN: Soft, non-tender, No Masses.  GU: Normal external genitalia DRE: Normal sphincter tone; prostate is large approximately 50 g without evidence of nodules or induration.  EXTREMITIES: Moves all extremities well.  Without clubbing, cyanosis, or edema. NEUROLOGIC:  Alert and oriented x 3, normal gait MENTAL STATUS:  Appropriate. BACK:  Non-tender to palpation.  No CVAT SKIN:  Warm, dry and intact.    Results: No results found for this or any previous visit (from the past 24 hour(s)).

## 2022-09-08 NOTE — Telephone Encounter (Signed)
Tried to call patient with no answer to discuss [employer] letter for service connected benefits.   Also tried calling wife with no answer- left VM.  Patient will need to get form completed at [facility] as he is trying to get service-connected benefits and I have no knowledge of his [place] service. We only treat his hypertension and erectile dysfunction- which I am unsure if he needs service-benefits for. I do not have access to his veteran pertinent records- therefore, a [place] physician will need to fill this form out.

## 2022-09-27 ENCOUNTER — Other Ambulatory Visit: Payer: Self-pay | Admitting: Student

## 2022-10-20 ENCOUNTER — Other Ambulatory Visit: Payer: BC Managed Care – PPO

## 2022-10-21 ENCOUNTER — Encounter: Payer: Self-pay | Admitting: Urology

## 2022-10-28 ENCOUNTER — Other Ambulatory Visit: Payer: Self-pay | Admitting: Student

## 2022-11-03 ENCOUNTER — Ambulatory Visit: Payer: BC Managed Care – PPO | Admitting: Urology

## 2022-12-13 ENCOUNTER — Other Ambulatory Visit: Payer: Self-pay | Admitting: Student

## 2023-02-21 ENCOUNTER — Other Ambulatory Visit: Payer: Self-pay | Admitting: Student

## 2023-04-08 ENCOUNTER — Other Ambulatory Visit: Payer: Self-pay | Admitting: Student

## 2023-05-21 ENCOUNTER — Other Ambulatory Visit: Payer: Self-pay | Admitting: Student

## 2023-06-26 ENCOUNTER — Other Ambulatory Visit: Payer: Self-pay | Admitting: Student

## 2023-07-04 ENCOUNTER — Telehealth: Payer: Self-pay | Admitting: Student

## 2023-07-04 NOTE — Telephone Encounter (Signed)
Copied from CRM [id number]. Topic: Appointments - Transfer of Care >> Jul 04, 2023  3:06 PM Samuel Jester B wrote: Pt is requesting to transfer FROM: Dameron,Marisa Pt is requesting to transfer TO: Dr. Abbe Amsterdam Reason for requested transfer: Not please with the services It is the responsibility of the team the patient would like to transfer to (Dr. Salomon Fick) to reach out to the patient if for any reason this transfer is not acceptable. **M. Dameron is a resident, not considered PCP. Will call to schedule NP appt

## 2023-07-25 ENCOUNTER — Other Ambulatory Visit: Payer: Self-pay | Admitting: Student

## 2023-08-23 ENCOUNTER — Other Ambulatory Visit: Payer: Self-pay | Admitting: Student

## 2023-09-23 ENCOUNTER — Other Ambulatory Visit: Payer: Self-pay | Admitting: Student

## 2023-11-08 ENCOUNTER — Encounter: Payer: Self-pay | Admitting: Adult Health

## 2023-11-08 ENCOUNTER — Ambulatory Visit: Admitting: Adult Health

## 2023-11-08 VITALS — BP 120/86 | HR 81 | Temp 98.6°F | Ht 73.0 in | Wt 208.0 lb

## 2023-11-08 DIAGNOSIS — I10 Essential (primary) hypertension: Secondary | ICD-10-CM

## 2023-11-08 DIAGNOSIS — N529 Male erectile dysfunction, unspecified: Secondary | ICD-10-CM

## 2023-11-08 DIAGNOSIS — H9313 Tinnitus, bilateral: Secondary | ICD-10-CM

## 2023-11-08 DIAGNOSIS — R351 Nocturia: Secondary | ICD-10-CM | POA: Diagnosis not present

## 2023-11-08 DIAGNOSIS — N401 Enlarged prostate with lower urinary tract symptoms: Secondary | ICD-10-CM | POA: Diagnosis not present

## 2023-11-08 DIAGNOSIS — Z0001 Encounter for general adult medical examination with abnormal findings: Secondary | ICD-10-CM

## 2023-11-08 DIAGNOSIS — Z0000 Encounter for general adult medical examination without abnormal findings: Secondary | ICD-10-CM

## 2023-11-08 MED ORDER — AMLODIPINE-OLMESARTAN 10-20 MG PO TABS: 1.0000 | ORAL_TABLET | Freq: Every day | ORAL | 3 refills | Status: DC

## 2023-11-08 MED ORDER — TAMSULOSIN HCL 0.4 MG PO CAPS
0.4000 mg | ORAL_CAPSULE | Freq: Every day | ORAL | 0 refills | Status: DC
Start: 2023-11-08 — End: 2024-03-22

## 2023-11-08 MED ORDER — SILDENAFIL CITRATE 100 MG PO TABS: 100.0000 mg | ORAL_TABLET | ORAL | 3 refills | Status: AC | PRN

## 2023-11-08 NOTE — Patient Instructions (Addendum)
 It was great seeing you today   We will follow up with you regarding your lab work   Please let me know if you need anything   Mychart Username: [email or web address]

## 2023-11-08 NOTE — Progress Notes (Signed)
 Patient presents to clinic today to establish care. He is a pleasant 58 year old male who  has a past medical history of Hypertension, Perirectal abscess (06/07/2011), and Rhinitis medicamentosa (06/23/2012).  Acute Concerns: Establish Care/CPE  Chronic Issues: Hypertension - managed with Azor  10-20 mg daily. He denies dizziness, lightheadedness, blurred vision or headaches  BP Readings from Last 3 Encounters:  11/08/23 120/86  09/08/22 131/86  08/27/22 124/82   BPH with Luts - managed with Flomax  0.4 mg daily. He was being seen by Urology but would like to go to a new urologist. He has had elevated PSA in the past - has not had a biopsy prior.  His symptoms include frequency, urgency, nocturia.  Lab Results  Component Value Date   PSA1 5.7 (H) 04/02/2022   PSA1 4.3 (H) 03/01/2022   PSA1 3.1 09/04/2019   PSA 2.29 01/07/2016   PSA 2.74 08/21/2014   PSA 4.00 08/15/2014      ED - uses Viagra  100 mg PRN   Tinnitus - he reports that he has had it for many years, likely from years in the [employer]. He reports that the [place] wants him to have an outside evaluation with ENT   Health Maintenance: Dental -- Does not do routine care Vision -- Does not do routine care Immunizations -- Refuses shingles.  Colon Cancer Screening - Last colonoscopy was in 2023 at [facility]. He is on the 5 year plan for polyps.  Diet: Tries to eat healthy  Exercise: He tries to stay active but does not exercise on a routine basis.    Past Medical History:  Diagnosis Date   Hypertension    Perirectal abscess 06/07/2011   Required I&D, most recently 05/2011 in ED.     Rhinitis medicamentosa 06/23/2012    Past Surgical History:  Procedure Laterality Date   TESTICLE SURGERY     unsure of reason   THUMB AMPUTATION     partial left-post accident    Current Outpatient Medications on File Prior to Visit  Medication Sig Dispense Refill   cetirizine  (ZYRTEC ) 10 MG tablet Take 1 tablet (10 mg total) by mouth  daily. 30 tablet 11   fluticasone  (FLONASE ) 50 MCG/ACT nasal spray Place 2 sprays into both nostrils daily. 16 g 6   No current facility-administered medications on file prior to visit.    No Known Allergies  Family History  Problem Relation Age of Onset   Heart disease Mother    Hypertension Father    Diabetes Father    Asthma Sister    Lupus Sister    Hypertension Brother    Diabetes Brother    Cancer Maternal Aunt        breast    Social History   Socioeconomic History   Marital status: Married    Spouse name: Not on file   Number of children: Not on file   Years of education: Not on file   Highest education level: Not on file  Occupational History   Not on file  Tobacco Use   Smoking status: Former    Current packs/day: 0.00    Average packs/day: 1 pack/day for 10.0 years (10.0 ttl pk-yrs)    Types: Cigarettes    Start date: 06/04/1997    Quit date: 06/05/2007    Years since quitting: 16.4   Smokeless tobacco: Never  Substance and Sexual Activity   Alcohol use: Yes    Comment: <1/week   Drug use: No   Sexual  activity: Yes  Other Topics Concern   Not on file  Social History Narrative   lives with son, wife, daughter, aunt      former smoker:  10-15 pack year history  quit 1-2 years ago      occasional etoh < 2 month      no ilicit drugs      pet rabbit and frogs      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 Son 26, daughter 21. No drinking on daily basis.    Smoked for 10-15 years <1ppd.    Social Drivers of Corporate investment banker Strain: Not on file  Food Insecurity: Not on file  Transportation Needs: Not on file  Physical Activity: Not on file  Stress: Not on file  Social Connections: Not on file  Intimate Partner Violence: Not on file    Review of Systems  Constitutional: Negative.   HENT: Negative.    Eyes: Negative.   Respiratory: Negative.    Cardiovascular: Negative.    Gastrointestinal: Negative.   Genitourinary:  Positive for urgency.  Skin: Negative.   Endo/Heme/Allergies: Negative.   Psychiatric/Behavioral: Negative.      BP 120/86   Pulse 81   Temp 98.6 F (37 C) (Oral)   Ht 6\' 1"  (1.854 m)   Wt 208 lb (94.3 kg)   SpO2 98%   BMI 27.44 kg/m   Physical Exam Vitals and nursing note reviewed.  Constitutional:      General: He is not in acute distress.    Appearance: Normal appearance. He is not ill-appearing.  HENT:     Head: Normocephalic and atraumatic.     Right Ear: Tympanic membrane, ear canal and external ear normal. There is no impacted cerumen.     Left Ear: Tympanic membrane, ear canal and external ear normal. There is no impacted cerumen.     Nose: Nose normal. No congestion or rhinorrhea.     Mouth/Throat:     Mouth: Mucous membranes are moist.     Pharynx: Oropharynx is clear.  Eyes:     Extraocular Movements: Extraocular movements intact.     Conjunctiva/sclera: Conjunctivae normal.     Pupils: Pupils are equal, round, and reactive to light.  Neck:     Vascular: No carotid bruit.  Cardiovascular:     Rate and Rhythm: Normal rate and regular rhythm.     Pulses: Normal pulses.     Heart sounds: No murmur heard.    No friction rub. No gallop.  Pulmonary:     Effort: Pulmonary effort is normal.     Breath sounds: Normal breath sounds.  Abdominal:     General: Abdomen is flat. Bowel sounds are normal. There is no distension.     Palpations: Abdomen is soft. There is no mass.     Tenderness: There is no abdominal tenderness. There is no guarding or rebound.     Hernia: No hernia is present.  Musculoskeletal:        General: Normal range of motion.     Cervical back: Normal range of motion and neck supple.  Lymphadenopathy:     Cervical: No cervical adenopathy.  Skin:    General: Skin is warm and dry.     Capillary Refill: Capillary refill takes less than 2 seconds.  Neurological:     General: No focal deficit  present.     Mental Status: He is alert and oriented to person, place, and time.  Psychiatric:        Mood and Affect: Mood normal.        Behavior: Behavior normal.        Thought Content: Thought content normal.        Judgment: Judgment normal.     No results found for this or any previous visit (from the past 2160 hours).  Assessment/Plan: 1. Routine general medical examination at a health care facility (Primary) Today patient counseled on age appropriate routine health concerns for screening and prevention, each reviewed and up to date or declined. Immunizations reviewed and up to date or declined. Labs ordered and reviewed. Risk factors for depression reviewed and negative. Hearing function and visual acuity are intact. ADLs screened and addressed as needed. Functional ability and level of safety reviewed and appropriate. Education, counseling and referrals performed based on assessed risks today. Patient provided with a copy of personalized plan for preventive services. - Follow up in one year or sooner if needed  2. Benign prostatic hyperplasia with nocturia  - PSA; Future - PSA - Ambulatory referral to Urology - tamsulosin  (FLOMAX ) 0.4 MG CAPS capsule; Take 1 capsule (0.4 mg total) by mouth daily after supper.  Dispense: 90 capsule; Refill: 0  Essential hypertension, benign - Well controlled. No change in medication  - Lipid panel; Future - TSH; Future - CBC; Future - Comprehensive metabolic panel with GFR; Future - Comprehensive metabolic panel with GFR - CBC - TSH - Lipid panel - amlodipine -olmesartan  (AZOR ) 10-20 MG tablet; Take 1 tablet by mouth daily.  Dispense: 90 tablet; Refill: 3  4. Tinnitus of both ears  - Ambulatory referral to ENT  5. Impotence due to erectile dysfunction  - Ambulatory referral to Urology - sildenafil  (VIAGRA ) 100 MG tablet; Take 1 tablet (100 mg total) by mouth as needed for erectile dysfunction. Take 1-2 hrs prior to sexual activity  ON EMPTY STOMACH  Dispense: 10 tablet; Refill: 3  Isiac Breighner, NP

## 2023-11-09 LAB — CBC
HCT: 43.7 % (ref 39.0–52.0)
Hemoglobin: 14.6 g/dL (ref 13.0–17.0)
MCHC: 33.4 g/dL (ref 30.0–36.0)
MCV: 90.3 fl (ref 78.0–100.0)
Platelets: 176 10*3/uL (ref 150.0–400.0)
RBC: 4.84 Mil/uL (ref 4.22–5.81)
RDW: 14.9 % (ref 11.5–15.5)
WBC: 4.6 10*3/uL (ref 4.0–10.5)

## 2023-11-09 LAB — LIPID PANEL
Cholesterol: 184 mg/dL (ref 0–200)
HDL: 45.6 mg/dL (ref >39.00)
LDL Cholesterol: 121 mg/dL — ABNORMAL HIGH (ref 0–99)
NonHDL: 137.99
Total CHOL/HDL Ratio: 4
Triglycerides: 83 mg/dL (ref 0.0–149.0)
VLDL: 16.6 mg/dL (ref 0.0–40.0)

## 2023-11-09 LAB — TSH: TSH: 2.2 u[IU]/mL (ref 0.35–5.50)

## 2023-11-09 LAB — COMPREHENSIVE METABOLIC PANEL WITH GFR
ALT: 22 U/L (ref 0–53)
AST: 16 U/L (ref 0–37)
Albumin: 4.5 g/dL (ref 3.5–5.2)
Alkaline Phosphatase: 67 U/L (ref 39–117)
BUN: 12 mg/dL (ref 6–23)
CO2: 26 meq/L (ref 19–32)
Calcium: 9.6 mg/dL (ref 8.4–10.5)
Chloride: 104 meq/L (ref 96–112)
Creatinine, Ser: 1.1 mg/dL (ref 0.40–1.50)
GFR: 73.23 mL/min (ref >60.00)
Glucose, Bld: 94 mg/dL (ref 70–99)
Potassium: 4.2 meq/L (ref 3.5–5.1)
Sodium: 138 meq/L (ref 135–145)
Total Bilirubin: 1 mg/dL (ref 0.2–1.2)
Total Protein: 7.6 g/dL (ref 6.0–8.3)

## 2023-11-09 LAB — PSA: PSA: 4.03 ng/mL — ABNORMAL HIGH (ref 0.10–4.00)

## 2023-11-16 DIAGNOSIS — R350 Frequency of micturition: Secondary | ICD-10-CM | POA: Diagnosis not present

## 2023-11-16 DIAGNOSIS — R972 Elevated prostate specific antigen [PSA]: Secondary | ICD-10-CM | POA: Diagnosis not present

## 2023-11-16 DIAGNOSIS — N529 Male erectile dysfunction, unspecified: Secondary | ICD-10-CM | POA: Diagnosis not present

## 2023-11-16 DIAGNOSIS — N401 Enlarged prostate with lower urinary tract symptoms: Secondary | ICD-10-CM | POA: Diagnosis not present

## 2023-12-30 ENCOUNTER — Ambulatory Visit: Payer: BC Managed Care – PPO | Admitting: Family Medicine

## 2024-01-19 ENCOUNTER — Encounter (INDEPENDENT_AMBULATORY_CARE_PROVIDER_SITE_OTHER): Payer: Self-pay | Admitting: Otolaryngology

## 2024-01-19 ENCOUNTER — Ambulatory Visit (INDEPENDENT_AMBULATORY_CARE_PROVIDER_SITE_OTHER): Admitting: Audiology

## 2024-01-19 ENCOUNTER — Ambulatory Visit (INDEPENDENT_AMBULATORY_CARE_PROVIDER_SITE_OTHER): Admitting: Otolaryngology

## 2024-01-19 VITALS — BP 151/90 | HR 56

## 2024-01-19 DIAGNOSIS — H903 Sensorineural hearing loss, bilateral: Secondary | ICD-10-CM

## 2024-01-19 DIAGNOSIS — H9313 Tinnitus, bilateral: Secondary | ICD-10-CM | POA: Diagnosis not present

## 2024-01-19 DIAGNOSIS — H6123 Impacted cerumen, bilateral: Secondary | ICD-10-CM | POA: Diagnosis not present

## 2024-01-19 NOTE — Progress Notes (Signed)
 CC: Bilateral hearing loss, tinnitus  HPI:  Joseph Singleton is a 58 y.o. male who presents today complaining of bilateral hearing loss and tinnitus.  According to the patient, he has been symptomatic since the 1980s.  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  He describes the tinnitus as a constant high-pitched ringing noise.  He has no previous ENT surgery.  He has never worn hearing aids.  He has no recent otitis media or otitis externa.  Currently he denies any otalgia, otorrhea, or vertigo.  Past Medical History:  Diagnosis Date   Hypertension    Perirectal abscess 06/07/2011   Required I&D, most recently 05/2011 in ED.     Rhinitis medicamentosa 06/23/2012    Past Surgical History:  Procedure Laterality Date   TESTICLE SURGERY     unsure of reason   THUMB AMPUTATION     partial left-post accident    Family History  Problem Relation Age of Onset   Heart disease Mother    Hypertension Father    Diabetes Father    Asthma Sister    Lupus Sister    Hypertension Brother    Diabetes Brother    Cancer Maternal Aunt        breast    Social History:  reports that he quit smoking about 16 years ago. His smoking use included cigarettes. He started smoking about 26 years ago. He has a 10 pack-year smoking history. He has never used smokeless tobacco. He reports current alcohol use. He reports that he does not use drugs.  Allergies: No Known Allergies  Prior to Admission medications   Medication Sig Start Date End Date Taking? Authorizing Provider  amlodipine -olmesartan  (AZOR ) 10-20 MG tablet Take 1 tablet by mouth daily. 11/08/23  Yes Singleton, Darleene, NP  cetirizine  (ZYRTEC ) 10 MG tablet Take 1 tablet (10 mg total) by mouth daily. 03/02/21  Yes Cresenzo, John V, MD  fluticasone  (FLONASE ) 50 MCG/ACT nasal spray Place 2 sprays into both nostrils daily. 03/02/21  Yes Cresenzo, John V, MD  sildenafil  (VIAGRA ) 100 MG tablet Take 1  tablet (100 mg total) by mouth as needed for erectile dysfunction. Take 1-2 hrs prior to sexual activity ON EMPTY STOMACH 11/08/23  Yes Singleton, Darleene, NP  tamsulosin  (FLOMAX ) 0.4 MG CAPS capsule Take 1 capsule (0.4 mg total) by mouth daily after supper. 11/08/23  Yes Singleton, Darleene, NP    Blood pressure (!) 151/90, pulse (!) 56, SpO2 97%. Exam: General: Communicates without difficulty, well nourished, no acute distress. Head: Normocephalic, no evidence injury, no tenderness, facial buttresses intact without stepoff. Face/sinus: No tenderness to palpation and percussion. Facial movement is normal and symmetric. Eyes: PERRL, EOMI. No scleral icterus, conjunctivae clear. Neuro: CN II exam reveals vision grossly intact.  No nystagmus at any point of gaze. Ears: Auricles well formed without lesions.  Bilateral cerumen impaction.  Nose: External evaluation reveals normal support and skin without lesions.  Dorsum is intact.  Anterior rhinoscopy reveals normal mucosa over anterior aspect of inferior turbinates and intact septum.  No purulence noted. Oral:  Oral cavity and oropharynx are intact, symmetric, without erythema or edema.  Mucosa is moist without lesions. Neck: Full range of motion without pain.  There is no significant lymphadenopathy.  No masses palpable.  Thyroid  bed within normal limits to palpation.  Parotid glands and submandibular glands equal bilaterally without mass.  Trachea is midline. Neuro:  CN 2-12 grossly intact.  Procedure: Bilateral cerumen disimpaction Anesthesia: None Description: Under the operating microscope, the cerumen is carefully removed with a combination of cerumen currette, alligator forceps, and suction catheters.  After the cerumen is removed, the TMs are noted to be normal.  No mass, erythema, or lesions. The patient tolerated the procedure well.    His hearing test shows bilateral mild high-frequency sensorineural hearing loss.  Assessment: 1.  Incidental finding of  bilateral cerumen impaction.  After the disimpaction procedure, both tympanic membranes and middle ear spaces are noted to be normal. 2.  Bilateral mild high-frequency sensorineural hearing loss. 3.  His tinnitus is likely a direct result of the hearing loss.  Plan: 1.  Otomicroscopy with bilateral cerumen disimpaction. 2.  The physical exam findings and the hearing test results are reviewed with the patient. 3.  The strategies to cope with tinnitus, including the use of masker, hearing aids, tinnitus retraining therapy, and avoidance of caffeine and alcohol are discussed. 4.  The patient is a candidate for hearing amplification. 5.  The patient is encouraged to call with any questions or concerns.  Gil Ingwersen W Julea Hutto 01/19/2024, 3:21 PM

## 2024-01-19 NOTE — Progress Notes (Signed)
  [address], [address] [place], [place] [address] [phone]  Audiological Evaluation    Name: Joseph Singleton     DOB:   1966-01-17      MRN:   [id number]                                                                                     Service Date: 01/19/2024     Accompanied by: unaccompanied to the booth   Patient comes today after Dr. Karis, ENT sent a referral for a hearing evaluation due to concerns with tinnitus.   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  Previous ear surgeries  []    Family history of hearing loss  []    Amplification  []    Other  [x]  Reports headaches . Also reports trouble sleeping.    Otoscopy: Right ear: Clear external ear canal and notable landmarks visualized on the tympanic membrane. Left ear:  Clear external ear canal and notable landmarks visualized on the tympanic membrane.  Tympanometry: Right ear: Type Ad- Normal external ear canal volume with normal middle ear pressure and high tympanic membrane compliance. Left ear: Type A- Normal external ear canal volume with normal middle ear pressure and tympanic membrane compliance.    Pure tone Audiometry: Both ears- Normal hearing from [phone] Hz, then mild sensorineural hearing loss from 2000 Hz - 8000 Hz.  Of note- hearing test re-checked after wax from the right ear was removed.   Speech Audiometry: Right ear- Speech Reception Threshold (SRT) was obtained at 20 dBHL. Left ear-Speech Reception Threshold (SRT) was obtained at 20 dBHL.   Word Recognition Score Tested using NU-6 (recorded) Right ear: 100% was obtained at a presentation level of 55 dBHL with contralateral masking which is deemed as  excellent. Left  ear: 100% was obtained at a presentation level of 55 dBHL with contralateral masking which is deemed as  excellent.   The hearing test results were completed under headphones and re-checked with inserts and results are deemed to be of good to fair reliability. Test technique:  conventional      Recommendations: Follow up with ENT as scheduled for today. Follow up with MD regarding sleep concerns. Return for a hearing evaluation if concerns with hearing changes arise or per MD recommendation. Consider a communication needs assessment after medical clearance for hearing aids is obtained. Recommend tinnitus evaluation or follow up at [facility] tinnitus [facility].  Michiah Mudry MARIE LEROUX-MARTINEZ, AUD

## 2024-01-22 DIAGNOSIS — H903 Sensorineural hearing loss, bilateral: Secondary | ICD-10-CM | POA: Insufficient documentation

## 2024-01-22 DIAGNOSIS — H6123 Impacted cerumen, bilateral: Secondary | ICD-10-CM | POA: Insufficient documentation

## 2024-01-22 DIAGNOSIS — H9313 Tinnitus, bilateral: Secondary | ICD-10-CM | POA: Insufficient documentation

## 2024-01-30 ENCOUNTER — Encounter: Payer: Self-pay | Admitting: Audiology

## 2024-03-20 ENCOUNTER — Other Ambulatory Visit: Payer: Self-pay | Admitting: Adult Health

## 2024-03-20 DIAGNOSIS — N401 Enlarged prostate with lower urinary tract symptoms: Secondary | ICD-10-CM

## 2024-05-28 DIAGNOSIS — R972 Elevated prostate specific antigen [PSA]: Secondary | ICD-10-CM | POA: Diagnosis not present

## 2024-06-04 DIAGNOSIS — R972 Elevated prostate specific antigen [PSA]: Secondary | ICD-10-CM | POA: Diagnosis not present

## 2024-06-04 DIAGNOSIS — N5201 Erectile dysfunction due to arterial insufficiency: Secondary | ICD-10-CM | POA: Diagnosis not present

## 2024-07-13 ENCOUNTER — Encounter: Admitting: Adult Health

## 2024-07-15 ENCOUNTER — Other Ambulatory Visit: Payer: Self-pay | Admitting: Adult Health

## 2024-07-15 DIAGNOSIS — N401 Enlarged prostate with lower urinary tract symptoms: Secondary | ICD-10-CM

## 2024-07-25 ENCOUNTER — Encounter: Payer: Self-pay | Admitting: Adult Health

## 2024-07-25 ENCOUNTER — Ambulatory Visit: Payer: Self-pay | Admitting: Adult Health

## 2024-07-25 ENCOUNTER — Ambulatory Visit: Admitting: Adult Health

## 2024-07-25 ENCOUNTER — Ambulatory Visit

## 2024-07-25 VITALS — BP 160/88 | HR 59 | Temp 98.2°F | Ht 73.0 in | Wt 207.0 lb

## 2024-07-25 DIAGNOSIS — M25561 Pain in right knee: Secondary | ICD-10-CM

## 2024-07-25 DIAGNOSIS — Z23 Encounter for immunization: Secondary | ICD-10-CM | POA: Diagnosis not present

## 2024-07-25 DIAGNOSIS — I10 Essential (primary) hypertension: Secondary | ICD-10-CM

## 2024-07-25 DIAGNOSIS — G8929 Other chronic pain: Secondary | ICD-10-CM

## 2024-07-25 DIAGNOSIS — N401 Enlarged prostate with lower urinary tract symptoms: Secondary | ICD-10-CM | POA: Diagnosis not present

## 2024-07-25 DIAGNOSIS — Z0001 Encounter for general adult medical examination with abnormal findings: Secondary | ICD-10-CM

## 2024-07-25 DIAGNOSIS — R351 Nocturia: Secondary | ICD-10-CM | POA: Diagnosis not present

## 2024-07-25 DIAGNOSIS — M25562 Pain in left knee: Secondary | ICD-10-CM | POA: Diagnosis not present

## 2024-07-25 DIAGNOSIS — Z0000 Encounter for general adult medical examination without abnormal findings: Secondary | ICD-10-CM

## 2024-07-25 DIAGNOSIS — M1990 Unspecified osteoarthritis, unspecified site: Secondary | ICD-10-CM

## 2024-07-25 LAB — COMPREHENSIVE METABOLIC PANEL WITH GFR
ALT: 19 U/L (ref 3–53)
AST: 15 U/L (ref 5–37)
Albumin: 4.2 g/dL (ref 3.5–5.2)
Alkaline Phosphatase: 64 U/L (ref 39–117)
BUN: 9 mg/dL (ref 6–23)
CO2: 30 meq/L (ref 19–32)
Calcium: 9.6 mg/dL (ref 8.4–10.5)
Chloride: 105 meq/L (ref 96–112)
Creatinine, Ser: 1.03 mg/dL (ref 0.40–1.50)
GFR: 78.85 mL/min
Glucose, Bld: 83 mg/dL (ref 70–99)
Potassium: 4.1 meq/L (ref 3.5–5.1)
Sodium: 138 meq/L (ref 135–145)
Total Bilirubin: 0.7 mg/dL (ref 0.2–1.2)
Total Protein: 7.4 g/dL (ref 6.0–8.3)

## 2024-07-25 LAB — LIPID PANEL
Cholesterol: 159 mg/dL (ref 28–200)
HDL: 45.1 mg/dL
LDL Cholesterol: 99 mg/dL (ref 10–99)
NonHDL: 113.5
Total CHOL/HDL Ratio: 4
Triglycerides: 72 mg/dL (ref 10.0–149.0)
VLDL: 14.4 mg/dL (ref 0.0–40.0)

## 2024-07-25 LAB — CBC
HCT: 42 % (ref 39.0–52.0)
Hemoglobin: 14.4 g/dL (ref 13.0–17.0)
MCHC: 34.4 g/dL (ref 30.0–36.0)
MCV: 87.2 fl (ref 78.0–100.0)
Platelets: 163 K/uL (ref 150.0–400.0)
RBC: 4.81 Mil/uL (ref 4.22–5.81)
RDW: 14.8 % (ref 11.5–15.5)
WBC: 4.5 K/uL (ref 4.0–10.5)

## 2024-07-25 LAB — PSA: PSA: 5.3 ng/mL — ABNORMAL HIGH (ref 0.10–4.00)

## 2024-07-25 MED ORDER — AMLODIPINE-OLMESARTAN 10-40 MG PO TABS: 1.0000 | ORAL_TABLET | Freq: Every day | ORAL | 0 refills | Status: AC

## 2024-07-25 NOTE — Patient Instructions (Signed)
 It was great seeing you today   We will follow up with you regarding your lab work   Please let me know if you need anything   Follow up in 30 days for blood pressure check

## 2024-07-25 NOTE — Progress Notes (Signed)
 "  Subjective:    Patient ID: Joseph Singleton, male    DOB: May 07, 1966, 59 y.o.   MRN: [id number]  HPI Patient presents for yearly preventative medicine examination. He is a pleasant 59 year old male who  has a past medical history of Hypertension, Perirectal abscess (06/07/2011), and Rhinitis medicamentosa (06/23/2012).  Hypertension - managed with Azor  10-20 mg daily. He denies dizziness, lightheadedness, blurred vision or headaches. He has not been checking his blood pressures at home routinely but when he does he reports that they are  up and down.  BP Readings from Last 3 Encounters:  07/25/24 (!) 160/88  01/19/24 (!) 151/90  11/08/23 120/86   BPH with Luts - managed with Flomax  0.4 mg daily and was recently placed on tadalafil  5 mg daily. He has been seen by [facility]. His symptoms seem to have improved with adding tadalafil  5 mg  Lab Results  Component Value Date   PSA1 5.7 (H) 04/02/2022   PSA1 4.3 (H) 03/01/2022   PSA1 3.1 09/04/2019   PSA 4.03 (H) 11/08/2023   PSA 2.29 01/07/2016   PSA 2.74 08/21/2014   ED - uses Cialsis 20 mg PRN  Chronic bilateral knee pain - reports worsening knee pain and stiffness, especially in the morning. Some days he has a hard time walking. His last xrays were in 2020 which showed degenerative changes.   All immunizations and health maintenance protocols were reviewed with the patient and needed orders were placed.  Appropriate screening laboratory values were ordered for the patient including screening of hyperlipidemia, renal function and hepatic function. If indicated by BPH, a PSA was ordered.  Medication reconciliation,  past medical history, social history, problem list and allergies were reviewed in detail with the patient  Goals were established with regard to weight loss, exercise, and  diet in compliance with medications Wt Readings from Last 3 Encounters:  07/25/24 207 lb (93.9 kg)  11/08/23 208 lb (94.3 kg)  09/08/22 205  lb (93 kg)   Review of Systems  Constitutional: Negative.   HENT: Negative.    Eyes: Negative.   Respiratory: Negative.    Cardiovascular: Negative.   Gastrointestinal: Negative.   Endocrine: Negative.   Genitourinary: Negative.   Musculoskeletal:  Positive for arthralgias and gait problem.  Skin: Negative.   Allergic/Immunologic: Negative.   Hematological: Negative.   Psychiatric/Behavioral: Negative.    All other systems reviewed and are negative.  Past Medical History:  Diagnosis Date   Hypertension    Perirectal abscess 06/07/2011   Required I&D, most recently 05/2011 in ED.     Rhinitis medicamentosa 06/23/2012    Social History   Socioeconomic History   Marital status: Married    Spouse name: Not on file   Number of children: Not on file   Years of education: Not on file   Highest education level: Not on file  Occupational History   Not on file  Tobacco Use   Smoking status: Former    Current packs/day: 0.00    Average packs/day: 1 pack/day for 10.0 years (10.0 ttl pk-yrs)    Types: Cigarettes    Start date: 06/04/1997    Quit date: 06/05/2007    Years since quitting: 17.1   Smokeless tobacco: Never  Substance and Sexual Activity   Alcohol use: Yes    Comment: <1/week   Drug use: No   Sexual activity: Yes  Other Topics Concern   Not on file  Social History Narrative   lives  with son, wife, daughter, aunt      former smoker:  10-15 pack year history  quit 1-2 years ago      occasional etoh < 2 month      no ilicit drugs      pet rabbit and frogs      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 Son 65, daughter 45. No drinking on daily basis.    Smoked for 10-15 years <1ppd.    Social Drivers of Health   Tobacco Use: Medium Risk (07/25/2024)   Patient History    Smoking Tobacco Use: Former    Smokeless Tobacco Use: Never    Passive Exposure: Not on Actuary Strain: Not on  file  Food Insecurity: Not on file  Transportation Needs: Not on file  Physical Activity: Not on file  Stress: Not on file  Social Connections: Not on file  Intimate Partner Violence: Not on file  Depression (PHQ2-9): Low Risk (07/25/2024)   Depression (PHQ2-9)    PHQ-2 Score: 0  Alcohol Screen: Not on file  Housing: Not on file  Utilities: Not on file  Health Literacy: Not on file    Past Surgical History:  Procedure Laterality Date   TESTICLE SURGERY     unsure of reason   THUMB AMPUTATION     partial left-post accident    Family History  Problem Relation Age of Onset   Heart disease Mother    Hypertension Father    Diabetes Father    Asthma Sister    Lupus Sister    Hypertension Brother    Diabetes Brother    Cancer Maternal Aunt        breast    Allergies[1]  Medications Ordered Prior to Encounter[2]  BP (!) 160/88   Pulse (!) 59   Temp 98.2 F (36.8 C) (Oral)   Ht 6' 1 (1.854 m)   Wt 207 lb (93.9 kg)   SpO2 97%   BMI 27.31 kg/m       Objective:   Physical Exam Vitals and nursing note reviewed.  Constitutional:      General: He is not in acute distress.    Appearance: Normal appearance. He is not ill-appearing.  HENT:     Head: Normocephalic and atraumatic.     Right Ear: Tympanic membrane, ear canal and external ear normal. There is no impacted cerumen.     Left Ear: Tympanic membrane, ear canal and external ear normal. There is no impacted cerumen.     Nose: Nose normal. No congestion or rhinorrhea.     Mouth/Throat:     Mouth: Mucous membranes are moist.     Pharynx: Oropharynx is clear.  Eyes:     Extraocular Movements: Extraocular movements intact.     Conjunctiva/sclera: Conjunctivae normal.     Pupils: Pupils are equal, round, and reactive to light.  Neck:     Vascular: No carotid bruit.  Cardiovascular:     Rate and Rhythm: Normal rate and regular rhythm.     Pulses: Normal pulses.     Heart sounds: No murmur heard.    No  friction rub. No gallop.  Pulmonary:     Effort: Pulmonary effort is normal.     Breath sounds: Normal breath sounds.  Abdominal:     General: Abdomen is flat. Bowel sounds are normal. There is  no distension.     Palpations: Abdomen is soft. There is no mass.     Tenderness: There is no abdominal tenderness. There is no guarding or rebound.     Hernia: No hernia is present.  Musculoskeletal:        General: Tenderness present. No swelling or deformity. Normal range of motion.     Cervical back: Normal range of motion and neck supple.  Lymphadenopathy:     Cervical: No cervical adenopathy.  Skin:    General: Skin is warm and dry.     Capillary Refill: Capillary refill takes less than 2 seconds.  Neurological:     General: No focal deficit present.     Mental Status: He is alert and oriented to person, place, and time.  Psychiatric:        Mood and Affect: Mood normal.        Behavior: Behavior normal.        Thought Content: Thought content normal.        Judgment: Judgment normal.        Assessment & Plan:  1. Routine general medical examination at a health care facility (Primary) Today patient counseled on age appropriate routine health concerns for screening and prevention, each reviewed and up to date or declined. Immunizations reviewed and up to date or declined. Labs ordered and reviewed. Risk factors for depression reviewed and negative. Hearing function and visual acuity are intact. ADLs screened and addressed as needed. Functional ability and level of safety reviewed and appropriate. Education, counseling and referrals performed based on assessed risks today. Patient provided with a copy of personalized plan for preventive services.   2. Essential hypertension, benign - Will increase Azor  to 10-40 mg due to uncontrolled.  - Keep log of home BP and follow up in 1 months  - Lipid panel; Future - CBC; Future - Comprehensive metabolic panel with GFR; Future -  amLODipine -olmesartan  (AZOR ) 10-40 MG tablet; Take 1 tablet by mouth daily.  Dispense: 90 tablet; Refill: 0  3. Benign prostatic hyperplasia with nocturia - Per urology  - PSA; Future  4. Need for tetanus booster  - Tdap vaccine greater than or equal to 7yo IM  5. Chronic pain of right knee - Consider steroid injections and/or referral to orthopedics  - DG Knee 1-2 Views Right; Future  6. Chronic pain of left knee  - DG Knee 1-2 Views Left; Future  Darleene Shape, NP     [1] No Known Allergies [2]  Current Outpatient Medications on File Prior to Visit  Medication Sig Dispense Refill   amlodipine -olmesartan  (AZOR ) 10-20 MG tablet Take 1 tablet by mouth daily. 90 tablet 3   sildenafil  (VIAGRA ) 100 MG tablet Take 1 tablet (100 mg total) by mouth as needed for erectile dysfunction. Take 1-2 hrs prior to sexual activity ON EMPTY STOMACH 10 tablet 3   tadalafil  (CIALIS ) 5 MG tablet Take 5 mg by mouth daily.     tamsulosin  (FLOMAX ) 0.4 MG CAPS capsule TAKE 1 CAPSULE BY MOUTH ONCE DAILY AFTER SUPPER 90 capsule 0   No current facility-administered medications on file prior to visit.   "

## 2024-07-31 ENCOUNTER — Telehealth: Payer: Self-pay

## 2024-07-31 NOTE — Telephone Encounter (Signed)
 Copied from CRM #[id number]. Topic: Clinical - Lab/Test Results >> Jul 31, 2024  1:49 PM Avram MATSU wrote: Reason for CRM: patient called to follow up with his labs results

## 2024-08-14 ENCOUNTER — Ambulatory Visit: Admitting: Family
